# Patient Record
Sex: Female | Born: 1980 | Race: White | Hispanic: No | Marital: Single | Smoking: Never smoker
Health system: Southern US, Community
[De-identification: ages and names within clinical notes are randomized; demographics above are authoritative.]

## PROBLEM LIST (undated history)

## (undated) DIAGNOSIS — M1990 Unspecified osteoarthritis, unspecified site: Secondary | ICD-10-CM

## (undated) DIAGNOSIS — E079 Disorder of thyroid, unspecified: Secondary | ICD-10-CM

## (undated) DIAGNOSIS — F329 Major depressive disorder, single episode, unspecified: Secondary | ICD-10-CM

## (undated) DIAGNOSIS — E039 Hypothyroidism, unspecified: Secondary | ICD-10-CM

## (undated) DIAGNOSIS — F419 Anxiety disorder, unspecified: Secondary | ICD-10-CM

## (undated) DIAGNOSIS — R51 Headache: Secondary | ICD-10-CM

## (undated) DIAGNOSIS — F32A Depression, unspecified: Secondary | ICD-10-CM

## (undated) HISTORY — PX: TYMPANOSTOMY TUBE PLACEMENT: SHX32

## (undated) HISTORY — PX: WISDOM TOOTH EXTRACTION: SHX21

## (undated) HISTORY — PX: TONSILLECTOMY: SHX5217

## (undated) HISTORY — DX: Disorder of thyroid, unspecified: E07.9

## (undated) HISTORY — DX: Unspecified osteoarthritis, unspecified site: M19.90

---
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
## 2000-05-15 ENCOUNTER — Other Ambulatory Visit: Admission: RE | Admit: 2000-05-15 | Discharge: 2000-05-15 | Payer: Self-pay | Admitting: Otolaryngology

## 2000-05-15 ENCOUNTER — Encounter (INDEPENDENT_AMBULATORY_CARE_PROVIDER_SITE_OTHER): Payer: Self-pay | Admitting: Specialist

## 2000-07-17 ENCOUNTER — Other Ambulatory Visit: Admission: RE | Admit: 2000-07-17 | Discharge: 2000-07-17 | Payer: Self-pay | Admitting: Obstetrics and Gynecology

## 2001-02-06 ENCOUNTER — Other Ambulatory Visit: Admission: RE | Admit: 2001-02-06 | Discharge: 2001-02-06 | Payer: Self-pay | Admitting: Obstetrics and Gynecology

## 2001-08-18 ENCOUNTER — Other Ambulatory Visit: Admission: RE | Admit: 2001-08-18 | Discharge: 2001-08-18 | Payer: Self-pay | Admitting: Obstetrics and Gynecology

## 2002-07-20 ENCOUNTER — Encounter: Admission: RE | Admit: 2002-07-20 | Discharge: 2002-07-20 | Payer: Self-pay | Admitting: Occupational Medicine

## 2002-07-20 ENCOUNTER — Encounter: Payer: Self-pay | Admitting: Occupational Medicine

## 2002-10-21 ENCOUNTER — Other Ambulatory Visit: Admission: RE | Admit: 2002-10-21 | Discharge: 2002-10-21 | Payer: Self-pay | Admitting: Obstetrics and Gynecology

## 2002-10-23 ENCOUNTER — Other Ambulatory Visit: Admission: RE | Admit: 2002-10-23 | Discharge: 2002-10-23 | Payer: Self-pay | Admitting: *Deleted

## 2004-01-14 ENCOUNTER — Other Ambulatory Visit: Admission: RE | Admit: 2004-01-14 | Discharge: 2004-01-14 | Payer: Self-pay | Admitting: Obstetrics and Gynecology

## 2004-03-31 ENCOUNTER — Other Ambulatory Visit: Admission: RE | Admit: 2004-03-31 | Discharge: 2004-03-31 | Payer: Self-pay | Admitting: Obstetrics and Gynecology

## 2004-04-25 ENCOUNTER — Encounter: Admission: RE | Admit: 2004-04-25 | Discharge: 2004-04-25 | Payer: Self-pay | Admitting: Specialist

## 2004-12-28 ENCOUNTER — Other Ambulatory Visit: Admission: RE | Admit: 2004-12-28 | Discharge: 2004-12-28 | Payer: Self-pay | Admitting: Obstetrics and Gynecology

## 2005-02-09 ENCOUNTER — Emergency Department (HOSPITAL_COMMUNITY): Admission: EM | Admit: 2005-02-09 | Discharge: 2005-02-09 | Payer: Self-pay | Admitting: Family Medicine

## 2005-03-08 HISTORY — PX: CERVICAL BIOPSY  W/ LOOP ELECTRODE EXCISION: SUR135

## 2005-07-12 ENCOUNTER — Other Ambulatory Visit: Admission: RE | Admit: 2005-07-12 | Discharge: 2005-07-12 | Payer: Self-pay | Admitting: Obstetrics & Gynecology

## 2005-11-01 ENCOUNTER — Other Ambulatory Visit: Admission: RE | Admit: 2005-11-01 | Discharge: 2005-11-01 | Payer: Self-pay | Admitting: Obstetrics & Gynecology

## 2006-05-14 ENCOUNTER — Other Ambulatory Visit: Admission: RE | Admit: 2006-05-14 | Discharge: 2006-05-14 | Payer: Self-pay | Admitting: Obstetrics and Gynecology

## 2006-11-07 ENCOUNTER — Other Ambulatory Visit: Admission: RE | Admit: 2006-11-07 | Discharge: 2006-11-07 | Payer: Self-pay | Admitting: Obstetrics & Gynecology

## 2007-05-09 ENCOUNTER — Other Ambulatory Visit: Admission: RE | Admit: 2007-05-09 | Discharge: 2007-05-09 | Payer: Self-pay | Admitting: Obstetrics and Gynecology

## 2008-01-16 ENCOUNTER — Other Ambulatory Visit: Admission: RE | Admit: 2008-01-16 | Discharge: 2008-01-16 | Payer: Self-pay | Admitting: Obstetrics and Gynecology

## 2009-11-10 ENCOUNTER — Encounter: Admission: RE | Admit: 2009-11-10 | Discharge: 2009-11-10 | Payer: Self-pay | Admitting: Internal Medicine

## 2011-12-27 LAB — HM PAP SMEAR: HM Pap smear: NEGATIVE

## 2012-10-03 ENCOUNTER — Telehealth: Payer: Self-pay | Admitting: Emergency Medicine

## 2012-10-03 NOTE — Telephone Encounter (Signed)
Spoke with Toni Amend at [facility] who advised that order for Pristique has prior authorization approval. ID # [id number].  [facility] [address] and they [facility] rx with approval.  Left message on voicemail to advise of approval from dates today-13/31/2039

## 2012-12-16 ENCOUNTER — Telehealth: Payer: Self-pay | Admitting: Nurse Practitioner

## 2012-12-17 MED ORDER — DESVENLAFAXINE SUCCINATE ER 50 MG PO TB24: ORAL_TABLET | ORAL | Status: DC

## 2012-12-17 NOTE — Telephone Encounter (Signed)
Last AEX 12/27/11 AEX scheduled for 01/12/13 Pristiq 50 mg #90/0refills (patient normally gets 3 months at a time)

## 2012-12-17 NOTE — Addendum Note (Signed)
Addended by: Lorraine Lax on: 12/17/2012 09:27 AM   Modules accepted: Orders

## 2013-01-07 ENCOUNTER — Encounter: Payer: Self-pay | Admitting: Nurse Practitioner

## 2013-01-12 ENCOUNTER — Ambulatory Visit (INDEPENDENT_AMBULATORY_CARE_PROVIDER_SITE_OTHER): Payer: BC Managed Care – PPO | Admitting: Nurse Practitioner

## 2013-01-12 ENCOUNTER — Encounter: Payer: Self-pay | Admitting: Nurse Practitioner

## 2013-01-12 VITALS — BP 110/64 | HR 60 | Ht 64.25 in | Wt 112.0 lb

## 2013-01-12 DIAGNOSIS — Z0000 Encounter for general adult medical examination without abnormal findings: Secondary | ICD-10-CM

## 2013-01-12 DIAGNOSIS — Z113 Encounter for screening for infections with a predominantly sexual mode of transmission: Secondary | ICD-10-CM

## 2013-01-12 DIAGNOSIS — Z01419 Encounter for gynecological examination (general) (routine) without abnormal findings: Secondary | ICD-10-CM

## 2013-01-12 LAB — POCT URINALYSIS DIPSTICK
GLUCOSE UA: NEGATIVE
Ketones, UA: NEGATIVE
NITRITE UA: NEGATIVE
Protein, UA: NEGATIVE
RBC UA: NEGATIVE
UROBILINOGEN UA: NEGATIVE
pH, UA: 6.5

## 2013-01-12 LAB — HEMOGLOBIN, FINGERSTICK: HEMOGLOBIN, FINGERSTICK: 13.2 g/dL (ref 12.0–16.0)

## 2013-01-12 NOTE — Patient Instructions (Signed)

## 2013-01-12 NOTE — Progress Notes (Signed)
Patient ID: Michelle Gross, female   DOB: [birth year], 33 y.o.   MRN: [id number] 33 y.o. G0P0 Single Caucasian Fe here for annual exam. Menses at 7 days. 3- days heavy with 1 day of very heavy changing overnight pad every 2 hours. States this last menses at mid cycle had a 'gush' of clear to white fluid enough to fill a pad.  Now dating with a new partner for a month.  Ended previous relationship in August.  Now working with a [place] based company in [place].  Patient's last menstrual period was 01/04/2013.          Sexually active: yes  The current method of family planning is none and condoms when necessary.    Exercising: yes  Gym/ health club routine includes curves twice weekly and walking the dogs. Smoker:  no  Health Maintenance: Pap:  12/27/11, WNL, neg HR HPV (history of CIN III) TDaP:  08/2008 Labs:  HB: 13.2 Urine:  3+ Bilirubin, trace leuk's, ph 6.5   reports that she has never smoked. She has never used smokeless tobacco.  Past Medical History  Diagnosis Date  . Thyroid disease     hypo  . Arthritis     Past Surgical History  Procedure Laterality Date  . 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 Tonsillectomy    . Fracture surgery Left 1999    x 3, arm fracture  . Tympanostomy tube placement      Current Outpatient Prescriptions  Medication Sig Dispense Refill  . desvenlafaxine (PRISTIQ) 50 MG 24 hr tablet TAKE 1 TABLET BY MOUTH EVERY DAY  90 tablet  0  . etodolac (LODINE) 400 MG tablet Take 400 mg by mouth daily.      Marland Kitchen levothyroxine (SYNTHROID, LEVOTHROID) 50 MCG tablet Take 50 mcg by mouth daily before breakfast.       No current facility-administered medications for this visit.    Family History  Problem Relation Age of Onset  . Breast cancer Mother 17    DCIS, lumpectomy and chemo  . Cancer Sister 8    thyroid  . Diabetes Paternal Grandmother   . Cancer Paternal Grandfather     liver  . Thyroid disease Maternal Grandmother      hypo  . Migraines Father   . Migraines Sister   . Thyroid disease Sister     hypo  . Osteoporosis Maternal Grandmother     ROS:  Pertinent items are noted in HPI.  Otherwise, a comprehensive ROS was negative.  Exam:   BP 110/64  Pulse 60  Ht 5' 4.25" (1.632 m)  Wt 112 lb (50.803 kg)  BMI 19.07 kg/m2  LMP 01/04/2013 Height: 5' 4.25" (163.2 cm)  Ht Readings from Last 3 Encounters:  01/12/13 5' 4.25" (1.632 m)    General appearance: alert, cooperative and appears stated age Head: Normocephalic, without obvious abnormality, atraumatic Neck: no adenopathy, supple, symmetrical, trachea midline and thyroid normal to inspection and palpation Lungs: clear to auscultation bilaterally Breasts: normal appearance, no masses or tenderness Heart: regular rate and rhythm Abdomen: soft, non-tender; no masses,  no organomegaly Extremities: extremities normal, atraumatic, no cyanosis or edema Skin: Skin color, texture, turgor normal. No rashes or lesions Lymph nodes: Cervical, supraclavicular, and axillary nodes normal. No abnormal inguinal nodes palpated Neurologic: Grossly normal   Pelvic: External genitalia:  no lesions              Urethra:  normal appearing urethra with no masses, tenderness or lesions              Bartholin's and Skene's: normal                 Vagina: normal appearing vagina with normal color and discharge, no lesions              Cervix: anteverted              Pap taken: no Bimanual Exam:  Uterus:  normal size, contour, position, consistency, mobility, non-tender              Adnexa: no mass, fullness, tenderness               Rectovaginal: Confirms               Anus:  normal sphincter tone, no lesions  A:  Well Woman with normal exam  Condoms for birth control  History of menorrhagia - did not do well on OCP in the past last time took OCP was 2004  R/O STD's  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

## 2013-01-13 ENCOUNTER — Telehealth: Payer: Self-pay | Admitting: Nurse Practitioner

## 2013-01-13 ENCOUNTER — Other Ambulatory Visit: Payer: Self-pay

## 2013-01-13 LAB — STD PANEL
HEP B S AG: NEGATIVE
HIV: NONREACTIVE

## 2013-01-13 MED ORDER — DESVENLAFAXINE SUCCINATE ER 50 MG PO TB24: ORAL_TABLET | ORAL | Status: DC

## 2013-01-13 NOTE — Progress Notes (Signed)
Encounter reviewed by Dr. Josefa Half. Patient will be contacted to come in for repeat urinalysis with micro and culture.

## 2013-01-13 NOTE — Telephone Encounter (Signed)
Patient calling re: for forgot to ask P. Michelle Gross for Prestiq refills at her AEX visit yesterday.  [facility] [address]

## 2013-01-13 NOTE — Telephone Encounter (Signed)
Patient had her AEX with PG on 01/12/13 and forgot to ask for this RX. Please sign if ok to refill. Her chart is on your desk, if needed.

## 2013-01-13 NOTE — Telephone Encounter (Signed)
Refill request routed to [facility] in epic.

## 2013-01-14 LAB — IPS PAP TEST WITH REFLEX TO HPV

## 2013-01-15 LAB — IPS N GONORRHOEA AND CHLAMYDIA BY PCR

## 2013-01-19 ENCOUNTER — Telehealth: Payer: Self-pay | Admitting: *Deleted

## 2013-01-19 MED ORDER — DESVENLAFAXINE SUCCINATE ER 50 MG PO TB24: 50.0000 mg | ORAL_TABLET | Freq: Every day | ORAL | Status: DC

## 2013-01-19 NOTE — Telephone Encounter (Signed)
Message copied by Graylon Good on Mon Jan 19, 2013  1:28 PM ------      Message from: Regina Eck      Created: Fri Jan 16, 2013  4:37 AM       STD screening negative      Pap smear negative 02      Sent to my chart ------

## 2013-01-19 NOTE — Telephone Encounter (Signed)
Ok to refill for a year? 

## 2013-01-19 NOTE — Telephone Encounter (Signed)
I have attempted to contact this patient by phone with the following results: left message to return my call on answering machine (mobile).  Per Waldemar Dickens, pt also needs urine micro and culture.

## 2013-01-20 ENCOUNTER — Other Ambulatory Visit: Payer: Self-pay | Admitting: Nurse Practitioner

## 2013-01-20 ENCOUNTER — Ambulatory Visit (INDEPENDENT_AMBULATORY_CARE_PROVIDER_SITE_OTHER): Payer: BC Managed Care – PPO

## 2013-01-20 VITALS — BP 115/71 | HR 82 | Resp 16 | Wt 114.2 lb

## 2013-01-20 DIAGNOSIS — R8290 Unspecified abnormal findings in urine: Secondary | ICD-10-CM

## 2013-01-20 DIAGNOSIS — R82998 Other abnormal findings in urine: Secondary | ICD-10-CM

## 2013-01-20 NOTE — Progress Notes (Signed)
Patient here for urine culture and micro. States she did have another episode of bleeding/spotting for about 12 hours after her exam.  Also has noticed that she will bleed/spot for about 12 hours after exercising and had the one episode of heavy bleeding after raking her yard. Patient aware PG will discuss this with Dr Sabra Heck to see what the next step is. Please advise-patient aware Dr Sabra Heck is out of the office until Thursday.

## 2013-01-20 NOTE — Telephone Encounter (Signed)
Pt notified on 1/12.  appt scheduled to come in on 1/13 for labs.

## 2013-01-21 LAB — URINALYSIS, MICROSCOPIC ONLY
BACTERIA UA: NONE SEEN
CRYSTALS: NONE SEEN
Casts: NONE SEEN
SQUAMOUS EPITHELIAL / LPF: NONE SEEN

## 2013-01-21 LAB — URINE CULTURE
Colony Count: NO GROWTH
Organism ID, Bacteria: NO GROWTH

## 2013-01-23 ENCOUNTER — Telehealth: Payer: Self-pay | Admitting: *Deleted

## 2013-01-23 ENCOUNTER — Telehealth: Payer: Self-pay | Admitting: Obstetrics & Gynecology

## 2013-01-23 NOTE — Telephone Encounter (Signed)
Message copied by Jaymes Graff on Fri Jan 23, 2013 12:49 PM ------      Message from: Megan Salon      Created: Fri Jan 23, 2013  7:03 AM                   ----- Message -----         From: Maryann Conners, CMA         Sent: 01/20/2013   4:35 PM           To: Lyman Speller, MD             ------

## 2013-01-23 NOTE — Addendum Note (Signed)
Addended by: Megan Salon on: 01/23/2013 07:03 AM   Modules accepted: Orders

## 2013-01-23 NOTE — Telephone Encounter (Signed)
Telephoned patient/ advised of $25 insurance quote for SHGM/ scheduled SHGM/ advised patient of cancellation policy and cancellation fee/ patient agreeable/ssf

## 2013-01-23 NOTE — Telephone Encounter (Signed)
Call to patient to advise of Dr Ammie Ferrier instruction. LMTCB. VM has name confirmation.

## 2013-01-23 NOTE — Progress Notes (Signed)
Pt has SHGM about a year and a half ago.  Reviewed Chart.  Needs Repeat Ultrasound.  She is an anxious person and has declined OCPs.  Order placed.

## 2013-02-03 NOTE — Telephone Encounter (Signed)
Patient would like reschedule upcoming Pus/ Shgm appointment. Patient needs her next appointment during a certain time of her cycle.

## 2013-02-04 NOTE — Telephone Encounter (Signed)
Message left to return call to [place] at [phone] could also talk to Gabriel Cirri if I am not available for [facility] appointment. Usually done within days 1-10 of cycle.

## 2013-02-05 ENCOUNTER — Other Ambulatory Visit: Payer: BC Managed Care – PPO | Admitting: Obstetrics & Gynecology

## 2013-02-05 ENCOUNTER — Other Ambulatory Visit: Payer: BC Managed Care – PPO

## 2013-02-06 ENCOUNTER — Telehealth: Payer: Self-pay | Admitting: *Deleted

## 2013-02-06 NOTE — Telephone Encounter (Signed)
Call to patient to advise of order for Cytotec prior to [facility]. Explained purpose and benefits of medication.  Patient states she has had this procedure without this medication and done fine.  She states it isnt as bad as her very bad periods.  Advised that since she has had this before without problems, it is ok to omit this step.  This was just to make it easier for patient.    Per pre-EPIC chart, she did have SHGM 11-07-11.  She requests to check with MD to confirm ok to omit this.  Advised I will call her Monday if Dr Sabra Heck wants her to take medication.  Pre-EPIC chart to your office.

## 2013-02-06 NOTE — Telephone Encounter (Signed)
PUS only is ok.

## 2013-02-06 NOTE — Telephone Encounter (Signed)
Yes that is a good idea.  Cytotec 220mcg pv night before and am of procedure. #2/0RF

## 2013-02-06 NOTE — Telephone Encounter (Signed)
Patient scheduled for [facility] with Dr. Sabra Heck on 02/12/13.  States started cycle on Tuesday 02/03/13 so 02/12/13 will be day 10.  Patient aware of cancellation policy.  Routing to provider for final review. Patient agreeable to disposition. Will close encounter

## 2013-02-06 NOTE — Telephone Encounter (Signed)
Agree.  Encounter closed.

## 2013-02-06 NOTE — Telephone Encounter (Signed)
Thank you. Encounter closed. 

## 2013-02-06 NOTE — Telephone Encounter (Signed)
Dr. Sabra Heck will patient need cytotec prior to [facility]? G0P0

## 2013-02-09 NOTE — Telephone Encounter (Signed)
Spoke with pt to give cytotec instructions, and pt states she doesn't think she will need this, as the last [facility] was so easy for her she had no pain. Pt would rather not take medication if she doesn't have to. Pt states, "unless this procedure is going to be different than the last one, I won't need it."

## 2013-02-12 ENCOUNTER — Other Ambulatory Visit: Payer: Self-pay | Admitting: Obstetrics & Gynecology

## 2013-02-12 ENCOUNTER — Ambulatory Visit (INDEPENDENT_AMBULATORY_CARE_PROVIDER_SITE_OTHER): Payer: BC Managed Care – PPO

## 2013-02-12 ENCOUNTER — Ambulatory Visit (INDEPENDENT_AMBULATORY_CARE_PROVIDER_SITE_OTHER): Payer: BC Managed Care – PPO | Admitting: Obstetrics & Gynecology

## 2013-02-12 ENCOUNTER — Other Ambulatory Visit: Payer: BC Managed Care – PPO

## 2013-02-12 DIAGNOSIS — N938 Other specified abnormal uterine and vaginal bleeding: Secondary | ICD-10-CM

## 2013-02-12 DIAGNOSIS — R8290 Unspecified abnormal findings in urine: Secondary | ICD-10-CM

## 2013-02-12 DIAGNOSIS — N920 Excessive and frequent menstruation with regular cycle: Secondary | ICD-10-CM

## 2013-02-12 DIAGNOSIS — N949 Unspecified condition associated with female genital organs and menstrual cycle: Secondary | ICD-10-CM

## 2013-02-12 DIAGNOSIS — D259 Leiomyoma of uterus, unspecified: Secondary | ICD-10-CM

## 2013-02-12 DIAGNOSIS — R82998 Other abnormal findings in urine: Secondary | ICD-10-CM

## 2013-02-12 DIAGNOSIS — N946 Dysmenorrhea, unspecified: Secondary | ICD-10-CM

## 2013-02-12 DIAGNOSIS — N9489 Other specified conditions associated with female genital organs and menstrual cycle: Secondary | ICD-10-CM

## 2013-02-12 DIAGNOSIS — D219 Benign neoplasm of connective and other soft tissue, unspecified: Secondary | ICD-10-CM

## 2013-02-12 DIAGNOSIS — N8500 Endometrial hyperplasia, unspecified: Secondary | ICD-10-CM

## 2013-02-12 NOTE — Progress Notes (Signed)
33 y.o. G0 SWF here for a pelvic ultrasound with sonohystogram due to increased bleeding.  Flow last almost seven days and is heavy for three days.  Sometimes passes clots.  She has experienced an episode or two of clear vaginal discharge, almost like a gush, as well.  She had an ultrasound about two years ago.  Coping with ending relationship ok.  Job change has been good for her.  Long term, hopes to be able to get married and have a child.    Indication: menorrhagia  Patient's last menstrual period was 02/03/2013.  Contraception: condoms but not needed right now.  Technique:  Both transabdominal and transvaginal ultrasound examinations of the pelvis were performed. Transabdominal technique was performed for global imaging of the pelvis including uterus,  ovaries, adnexal regions, and pelvic cul-de-sac.  It was necessary to proceed with endovaginal exam following the abdominal ultrasound transabdominal exam to visualize the endometrium and adnexa.  Color and duplex Doppler ultrasound was utilized to evaluate blood flow to the ovaries.   FINDINGS: UTERUS: 7.7 x 5.2 x 4.3cm with probable two fibroids 1.4 and 1.8cm EMS: 9.62mm ADNEXA: left ovary 3.8 x 2.4 x 8.2XH with 3.7JI follicle Right ovary 3.7 x 2.0 x 2.5cm CUL DE SAC: no free fluid  SHSG:  After obtaining appropriate verbal consent from patient, the cervix was visualized using a speculum, and prepped with betadine.  A tenaculum  was applied to the cervix.  Dilation of the cervix was not necessary. The catheter was passed into the uterus and sterile saline introduced, with the following findings: 16 x 46mm probable endometrial polyp.  Images reviewed with patient.  Due to bleeding, hysteroscopic resection with D&C recommended.  Procedure discussed with patient.  Recovery and pain management discussed.  Risks discussed including but not limited to bleeding, rare risk of transfusion, infection, 1% risk of uterine perforation with risks of fluid  deficit causing cardiac arrythmia, cerebral swelling and/or need to stop procedure early.  Fluid emboli and rare risk of death discussed.  DVT/PE, rare risk of risk of bowel/bladder/ureteral/vascular injury.  Patient aware if pathology abnormal she may need additional treatment.  All questions answered.    After discussing above, pt wants to consider laparoscopy at the same time.  I have felt, over the years that I have known her, that endometriosis is also a possibility for her.  She would like to consider this as well due to pelvic pain.  Incision locations, risks of bleeding and infection, rare risk of bowel/bladder/ureteral injury, DVT/PE, possible need to treat endometriosis, and possible need for additional and possibly non-surgical therapies discussed.  Pt in agreement with plan and ready to proceed.  All questions answered.  Assessment:  Menorrhagia, uterine fibroids, endometrial polyp, chronic pelvic pain Plan:  Hysteroscopy with polyp resection, D&C planned, diagnostic laparoscopy with possible cauterization of endometriosis planned.  Will have surgery scheduled.  Pt to be called with information/directions.  ~30 minutes spent with patient >50% of time was in face to face discussion of above.

## 2013-02-19 ENCOUNTER — Telehealth: Payer: Self-pay | Admitting: Obstetrics & Gynecology

## 2013-02-19 NOTE — Telephone Encounter (Signed)
Return call to patient regarding surgery.  States that Tuesday would be slightly easier.  Will schedule and call her back.   Case request for Hysteroscopy/D&C/Resectoscope sent for 03-16-13.

## 2013-02-19 NOTE — Telephone Encounter (Signed)
Patient calling to set up surgery.

## 2013-02-20 ENCOUNTER — Telehealth: Payer: Self-pay | Admitting: Obstetrics & Gynecology

## 2013-02-20 NOTE — Telephone Encounter (Signed)
Thank you.  You can close encounter when case scheduled.

## 2013-02-20 NOTE — Telephone Encounter (Signed)
Lmtcb, need to go over surgery benefits

## 2013-02-25 NOTE — Telephone Encounter (Signed)
lmtcb to discuss insurance information for upcoming surgery

## 2013-02-26 NOTE — Telephone Encounter (Signed)
Patient returned call. Advised that per benefits quoted, patient liability for surgeons fees is [phone]. Payment is due 02.23.2015.  Patient agrees and will mail 02.20.2015.

## 2013-02-27 ENCOUNTER — Encounter (HOSPITAL_COMMUNITY): Payer: Self-pay | Admitting: Pharmacist

## 2013-03-03 ENCOUNTER — Telehealth: Payer: Self-pay | Admitting: *Deleted

## 2013-03-03 NOTE — Telephone Encounter (Signed)
Patient calling to ask how long to expect to be in surgery.  Discussed that sche is scheduled for approximately one hour but may not take that long. Patient asking if this includes the laparoscopy and any treatment for endometriosis.  Advised I will check with Dr Sabra Heck and call her back. Surgical instructions reviewed and will mail to patient.  Post op scheduled for 03-30-13, 2 weeks post surgery.  Scheduled as Hysteroscopy/D&C.  Do I need to add Laparoscopy? Does she need preop?

## 2013-03-03 NOTE — Telephone Encounter (Signed)
Patient called today with additional questions regarding surgery.  Wanted to know how long to expect surgery to take.  Also if she should take medications morning of surgery.  Advised hosp anesthesia will determine what meds to take morning of, follow their instructions.  Patient states she would like to have laparoscopy done since she is having the D&C.  Dr Sabra Heck has talked with her about this on several occasions to check for endometriosis and patient would like to have both procedures.  Advised will review with Dr Sabra Heck and hospital and call her back.

## 2013-03-03 NOTE — Telephone Encounter (Signed)
Ok to schedule laparoscopy and hysteroscopy same day.

## 2013-03-09 NOTE — Telephone Encounter (Signed)
That is fine.  We can bill after the surgery is done.

## 2013-03-09 NOTE — Telephone Encounter (Signed)
Patient is calling Michelle Gross to follow up on information about surgery.

## 2013-03-09 NOTE — Telephone Encounter (Signed)
Per Gay Filler, add laparoscopy [phone]) to this patients surgery. Patients deductible is still not met.  Patient liability for added charge is $647.59.  I telephoned patient with this information. She states that she cannot make this additional payment prior to her surgery date.

## 2013-03-09 NOTE — Telephone Encounter (Signed)
Call back to patient, LM (VM confirms name) that surgery is scheduled with laparoscopy added for 03-16-13.  Call back for additional clarification.

## 2013-03-10 ENCOUNTER — Encounter: Payer: Self-pay | Admitting: Obstetrics & Gynecology

## 2013-03-10 DIAGNOSIS — N9489 Other specified conditions associated with female genital organs and menstrual cycle: Secondary | ICD-10-CM | POA: Insufficient documentation

## 2013-03-10 DIAGNOSIS — N946 Dysmenorrhea, unspecified: Secondary | ICD-10-CM | POA: Insufficient documentation

## 2013-03-10 DIAGNOSIS — N920 Excessive and frequent menstruation with regular cycle: Secondary | ICD-10-CM | POA: Insufficient documentation

## 2013-03-10 DIAGNOSIS — D219 Benign neoplasm of connective and other soft tissue, unspecified: Secondary | ICD-10-CM | POA: Insufficient documentation

## 2013-03-10 NOTE — Telephone Encounter (Signed)
Telephone patient. Advised patient that she would be billed after the surgery for the added charge. Patient agreeable.

## 2013-03-11 ENCOUNTER — Telehealth: Payer: Self-pay | Admitting: *Deleted

## 2013-03-11 NOTE — Telephone Encounter (Signed)
See next phone message for additional information regarding surgery schedule.  Encounter closed.

## 2013-03-11 NOTE — Telephone Encounter (Signed)
Call to patient and notified That Dr Sabra Heck summoned for jury duty and now assigned to a trial.  Potential need for reschedule of surgery date if case has not concluded by end of week.  Patient very understanding.  I will update her on Friday afternoon.   Patient needs note for work that she will be out on Monday for the surgery. She states she was told she could return to work the following day.  Advised that initially when scheduled  For hysteroscopy only, that would have been realistic but now with laparoscopy and abdominal incision, although only about a inch long, may need additional time, possibly even a week depending on what is found and/or treated.    Please advise on minimum recommended time out of work.  Patient decided to wait till Monday and see if surgery even occurs and then have [place] fax note to her employer.

## 2013-03-12 ENCOUNTER — Encounter (HOSPITAL_COMMUNITY): Payer: Self-pay

## 2013-03-12 ENCOUNTER — Encounter (HOSPITAL_COMMUNITY)
Admission: RE | Admit: 2013-03-12 | Discharge: 2013-03-12 | Disposition: A | Discharge status: Home / Self Care | Payer: BC Managed Care – PPO | Source: Ambulatory Visit | Attending: Obstetrics & Gynecology | Admitting: Obstetrics & Gynecology

## 2013-03-12 DIAGNOSIS — Z01812 Encounter for preprocedural laboratory examination: Secondary | ICD-10-CM | POA: Insufficient documentation

## 2013-03-12 HISTORY — DX: Depression, unspecified: F32.A

## 2013-03-12 HISTORY — DX: Hypothyroidism, unspecified: E03.9

## 2013-03-12 HISTORY — DX: Headache: R51

## 2013-03-12 HISTORY — DX: Anxiety disorder, unspecified: F41.9

## 2013-03-12 HISTORY — DX: Major depressive disorder, single episode, unspecified: F32.9

## 2013-03-12 LAB — CBC
HCT: 36.8 % (ref 36.0–46.0)
HEMOGLOBIN: 12.4 g/dL (ref 12.0–15.0)
MCH: 30.4 pg (ref 26.0–34.0)
MCHC: 33.7 g/dL (ref 30.0–36.0)
MCV: 90.2 fL (ref 78.0–100.0)
Platelets: 229 10*3/uL (ref 150–400)
RBC: 4.08 MIL/uL (ref 3.87–5.11)
RDW: 12.5 % (ref 11.5–15.5)
WBC: 7.2 10*3/uL (ref 4.0–10.5)

## 2013-03-12 NOTE — Telephone Encounter (Signed)
Patient notified of Dr Ammie Ferrier recommended time out of work.  Encounter closed.

## 2013-03-12 NOTE — Telephone Encounter (Signed)
With laparoscopy, she should prepare for being out of work for one week.

## 2013-03-12 NOTE — Patient Instructions (Addendum)
   Your procedure is scheduled on:  Monday, Mar 9  Enter through the [employer] [facility] at:  6 AM Pick up the phone at the desk and dial [phone] and inform us of your arrival.  Please call this number if you have any problems the morning of surgery: [phone]  Remember: Do not eat or drink after midnight: Sunday Take these medicines the morning of surgery with a SIP OF WATER: synthroid, pristiq  Do not wear jewelry, make-up, or FINGER nail polish No metal in your hair or on your body. Do not wear lotions, powders, perfumes.  You may wear deodorant.  Do not bring valuables to the hospital. Contacts, dentures or bridgework may not be worn into surgery.  Patients discharged on the day of surgery will not be allowed to drive home.  Home with friend Fayne Norrie cell [phone].

## 2013-03-12 NOTE — Telephone Encounter (Signed)
Call to patient, VM has name confirmation.  LM that due to extended jury duty, we will need to cancel surgery on Monday 03-16-13 and reschedule.  LMTCB with new date preferences.

## 2013-03-16 NOTE — Telephone Encounter (Signed)
LM on hosp PAT dept VM to check on PAT requirements for reschedule surgery date of 05-05-13. LMTCB.  Spoke to [place] at hosp central scheduling and surgery is rescheduled to 05-05-13 at [place], Case [phone].

## 2013-03-16 NOTE — Telephone Encounter (Signed)
Return call to patient regarding date preference for rescheduling surgery.  Patient requests first available Tuesday at 730 since this is really what works best for her schedule.  Looking at hospital schedule, first available Tuesday at 730 is 05-05-13.  (first avail Monday at 730 is 04-27-13) since only a week difference, she prefers the Tuesday 05-05-13 due to easier for her schedule on a Tuesday.  Will reschedule and call patient back.  Patient also checking on how long labs done at pre-op will be good for?  Advised I have to check at hospital for this info.

## 2013-03-16 NOTE — Telephone Encounter (Signed)
Pt returning call to reschedule surgery.

## 2013-03-18 NOTE — Telephone Encounter (Signed)
Michelle Gross from hosp PAT called back on 03-16-13 and advised me that patient had a normal CBC and that since >30 days since lab was done, would need repeat.  Since she did not have medical conditions and lab result was normal, Dr Sabra Heck and anesthesia could review morning of surgery and if needed, could repeat it then.  She would not have to have another pre-op at hospital.  Michelle Gross said she would advise patient of this.

## 2013-03-18 NOTE — Telephone Encounter (Signed)
Routing to provider for final review. Patient agreeable to disposition. Will close encounter.     

## 2013-03-25 ENCOUNTER — Telehealth: Payer: Self-pay | Admitting: Obstetrics & Gynecology

## 2013-03-25 NOTE — Telephone Encounter (Signed)
Gay Filler just wanted to bring to your attention that patient needs a post op appt she called this morning and canceled the original because her surgery got postponed from dr Sanjuan Dame jury duty.

## 2013-03-30 ENCOUNTER — Ambulatory Visit: Payer: BC Managed Care – PPO | Admitting: Obstetrics & Gynecology

## 2013-03-31 NOTE — Telephone Encounter (Signed)
Call to patient and rescheduled post op to 05-19-13 based on rescheduled surgery date of 05-05-13.  Previous date canceled due to provider on jury duty. Updated surgery instruction sheet mailed.  Do you want to see her again prior to surgery date?

## 2013-04-01 NOTE — Telephone Encounter (Signed)
No, I don't need to see her again.  Thank you.  Encounter closed.

## 2013-04-12 ENCOUNTER — Emergency Department (INDEPENDENT_AMBULATORY_CARE_PROVIDER_SITE_OTHER)
Admission: EM | Admit: 2013-04-12 | Discharge: 2013-04-12 | Disposition: A | Discharge status: Home / Self Care | Payer: BC Managed Care – PPO | Source: Home / Self Care | Attending: Emergency Medicine | Admitting: Emergency Medicine

## 2013-04-12 ENCOUNTER — Emergency Department (HOSPITAL_COMMUNITY): Payer: BC Managed Care – PPO

## 2013-04-12 ENCOUNTER — Encounter (HOSPITAL_COMMUNITY): Payer: Self-pay | Admitting: Emergency Medicine

## 2013-04-12 ENCOUNTER — Emergency Department (INDEPENDENT_AMBULATORY_CARE_PROVIDER_SITE_OTHER): Payer: BC Managed Care – PPO

## 2013-04-12 DIAGNOSIS — J111 Influenza due to unidentified influenza virus with other respiratory manifestations: Secondary | ICD-10-CM

## 2013-04-12 DIAGNOSIS — R69 Illness, unspecified: Principal | ICD-10-CM

## 2013-04-12 MED ORDER — BENZONATATE 100 MG PO CAPS: 100.0000 mg | ORAL_CAPSULE | Freq: Three times a day (TID) | ORAL | Status: DC | PRN

## 2013-04-12 MED ORDER — HYDROCOD POLST-CHLORPHEN POLST 10-8 MG/5ML PO LQCR: 5.0000 mL | Freq: Two times a day (BID) | ORAL | Status: DC | PRN

## 2013-04-12 MED ORDER — OSELTAMIVIR PHOSPHATE 75 MG PO CAPS: 75.0000 mg | ORAL_CAPSULE | Freq: Two times a day (BID) | ORAL | Status: DC

## 2013-04-12 NOTE — ED Notes (Signed)
Pt  Reports     Symptoms  Of  Cough   Congested  Fever off and  On      With  Sinus drainage   Since  Friday              Symptoms   releived by otc meds  But  The  Cough  For the  Mi=ost part is  Non   Productive

## 2013-04-12 NOTE — ED Provider Notes (Signed)
Medical screening examination/treatment/procedure(s) were performed by non-physician practitioner and as supervising physician I was immediately available for consultation/collaboration.  Hoy Fallert, M.D.  Saliyah Gillin C Tryniti Laatsch, MD 04/12/13 2125 

## 2013-04-12 NOTE — ED Provider Notes (Signed)
CSN: [id number]     Arrival date & time 04/12/13  1554 History   First MD Initiated Contact with Patient 04/12/13 1606     Chief Complaint  Patient presents with  . 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 Returned on 04-09-2013 and URI sx began 04-10-2013.  Non-smoker.   Patient is a 33 y.o. female presenting with URI.  URI Presenting symptoms: congestion, cough, fatigue and fever   Presenting symptoms comment:  +nasal congestion Severity:  Moderate Onset quality:  Gradual Duration:  2 days Timing:  Constant Progression:  Worsening Chronicity:  New Associated symptoms: headaches and myalgias   Risk factors: recent travel     Past Medical History  Diagnosis Date  . Thyroid disease     hypo  . Depression   . Hypothyroidism   . Anxiety   . Arthritis     hands/feet/knee  . Headache(784.0)     otc med prn   Past Surgical History  Procedure Laterality Date  . 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 Tonsillectomy    . Fracture surgery Left 1999    x 3, arm fracture  . Tympanostomy tube placement    . Wisdom tooth extraction     Family History  Problem Relation Age of Onset  . Breast cancer Mother 24    DCIS, lumpectomy and chemo  . Cancer Sister 74    thyroid  . Diabetes Paternal Grandmother   . Cancer Paternal Grandfather     liver  . Thyroid disease Maternal Grandmother     hypo  . Migraines Father   . Migraines Sister   . Thyroid disease Sister     hypo  . Osteoporosis Maternal Grandmother    History  Substance Use Topics  . Smoking status: Never Smoker   . Smokeless tobacco: Never Used  . Alcohol Use: Yes     Comment: social   OB History   Grav Para Term Preterm Abortions TAB SAB Ect Mult Living   0 0             Review of Systems  Constitutional: Positive for fever and fatigue.   HENT: Positive for congestion.   Eyes: Negative.   Respiratory: Positive for cough.   Cardiovascular:       States chest is very sore from couging  Genitourinary: Negative.   Musculoskeletal: Positive for myalgias.  Neurological: Positive for headaches.    Allergies  Codeine; Neomycin; Prednisone; and Sulfa antibiotics  Home Medications   Current Outpatient Rx  Name  Route  Sig  Dispense  Refill  . Armodafinil (NUVIGIL) 150 MG tablet   Oral   Take 75 mg by mouth daily as needed.         . chlorpheniramine-HYDROcodone (TUSSIONEX) 10-8 MG/5ML LQCR   Oral   Take 5 mLs by mouth every 12 (twelve) hours as needed for cough.   50 mL   0   . colesevelam (WELCHOL) 625 MG tablet   Oral   Take 625 mg by mouth daily as needed.         . desvenlafaxine (PRISTIQ) 50 MG 24 hr tablet   Oral   Take 1 tablet (50 mg total) by mouth daily.   90 tablet   3     NEEDS REFILLS   . ibuprofen (ADVIL,MOTRIN) 200 MG tablet   Oral  Take 400-800 mg by mouth every 6 (six) hours as needed for headache, moderate pain or cramping.         Marland Kitchen levothyroxine (SYNTHROID, LEVOTHROID) 50 MCG tablet   Oral   Take 50 mcg by mouth daily before breakfast.         . oseltamivir (TAMIFLU) 75 MG capsule   Oral   Take 1 capsule (75 mg total) by mouth every 12 (twelve) hours.   10 capsule   0    BP 116/74  Pulse 114  Temp(Src) 99.5 F (37.5 C) (Oral)  Resp 25  SpO2 100%  LMP 04/06/2013 Physical Exam  Nursing note and vitals reviewed. Constitutional: She is oriented to person, place, and time. She appears well-developed and well-nourished. No distress.  HENT:  Head: Normocephalic and atraumatic.  Right Ear: Hearing, tympanic membrane, external ear and ear canal normal.  Left Ear: Hearing, tympanic membrane, external ear and ear canal normal.  Nose: Nose normal.  Mouth/Throat: Uvula is midline, oropharynx is clear and moist and mucous membranes are normal.  Eyes: Conjunctivae are normal.  Right eye exhibits no discharge. Left eye exhibits no discharge. No scleral icterus.  Cardiovascular: Regular rhythm and normal heart sounds.   +mild tachycardia  Pulmonary/Chest: Effort normal and breath sounds normal. No respiratory distress. She has no wheezes.  Musculoskeletal: Normal range of motion.  Lymphadenopathy:    She has no cervical adenopathy.  Neurological: She is alert and oriented to person, place, and time.  Skin: Skin is warm and dry.  Psychiatric: She has a normal mood and affect. Her behavior is normal.    ED Course  Procedures (including critical care time) Labs Review Labs Reviewed - No data to display Imaging Review Dg Chest 2 View  04/12/2013   CLINICAL DATA:  Cough, fever  EXAM: CHEST  2 VIEW  COMPARISON:  11/10/2009  FINDINGS: Cardiomediastinal silhouette is stable. Mild upper thoracic levoscoliosis. No acute infiltrate or pleural effusion. No pulmonary edema.  IMPRESSION: No active cardiopulmonary disease.   Electronically Signed   By: Lahoma Crocker M.D.   On: 04/12/2013 16:35     MDM   1. Influenza-like illness    Influenza-like illness. Will treat with Tamiflu and cough suppressant and advise close follow up with PCP. CXR unremarkable.     [place], [place] 04/12/13 1719

## 2013-05-04 MED ORDER — DEXTROSE 5 % IV SOLN
2.0000 g | INTRAVENOUS | Status: AC
  Administered 2013-05-05: 2 g via INTRAVENOUS
  Filled 2013-05-04: qty 2

## 2013-05-05 ENCOUNTER — Ambulatory Visit (HOSPITAL_COMMUNITY)
Admission: RE | Admit: 2013-05-05 | Discharge: 2013-05-05 | Disposition: A | Discharge status: Home / Self Care | Payer: BC Managed Care – PPO | Source: Ambulatory Visit | Attending: Obstetrics & Gynecology | Admitting: Obstetrics & Gynecology

## 2013-05-05 ENCOUNTER — Ambulatory Visit (HOSPITAL_COMMUNITY): Payer: BC Managed Care – PPO | Admitting: Anesthesiology

## 2013-05-05 ENCOUNTER — Other Ambulatory Visit: Payer: Self-pay | Admitting: Obstetrics & Gynecology

## 2013-05-05 ENCOUNTER — Encounter (HOSPITAL_COMMUNITY): Payer: Self-pay | Admitting: Anesthesiology

## 2013-05-05 ENCOUNTER — Encounter (HOSPITAL_COMMUNITY): Payer: BC Managed Care – PPO | Admitting: Anesthesiology

## 2013-05-05 ENCOUNTER — Encounter (HOSPITAL_COMMUNITY)
Admission: RE | Disposition: A | Discharge status: Home / Self Care | Payer: Self-pay | Source: Ambulatory Visit | Attending: Obstetrics & Gynecology

## 2013-05-05 DIAGNOSIS — F329 Major depressive disorder, single episode, unspecified: Secondary | ICD-10-CM | POA: Insufficient documentation

## 2013-05-05 DIAGNOSIS — E039 Hypothyroidism, unspecified: Secondary | ICD-10-CM | POA: Insufficient documentation

## 2013-05-05 DIAGNOSIS — N8500 Endometrial hyperplasia, unspecified: Secondary | ICD-10-CM

## 2013-05-05 DIAGNOSIS — Z803 Family history of malignant neoplasm of breast: Secondary | ICD-10-CM | POA: Insufficient documentation

## 2013-05-05 DIAGNOSIS — N949 Unspecified condition associated with female genital organs and menstrual cycle: Secondary | ICD-10-CM | POA: Insufficient documentation

## 2013-05-05 DIAGNOSIS — N9489 Other specified conditions associated with female genital organs and menstrual cycle: Secondary | ICD-10-CM

## 2013-05-05 DIAGNOSIS — N946 Dysmenorrhea, unspecified: Secondary | ICD-10-CM | POA: Insufficient documentation

## 2013-05-05 DIAGNOSIS — N801 Endometriosis of ovary: Secondary | ICD-10-CM | POA: Insufficient documentation

## 2013-05-05 DIAGNOSIS — N803 Endometriosis of pelvic peritoneum, unspecified: Secondary | ICD-10-CM | POA: Insufficient documentation

## 2013-05-05 DIAGNOSIS — F3289 Other specified depressive episodes: Secondary | ICD-10-CM | POA: Insufficient documentation

## 2013-05-05 DIAGNOSIS — N840 Polyp of corpus uteri: Secondary | ICD-10-CM | POA: Insufficient documentation

## 2013-05-05 DIAGNOSIS — E0500 Thyrotoxicosis with diffuse goiter without thyrotoxic crisis or storm: Secondary | ICD-10-CM | POA: Insufficient documentation

## 2013-05-05 DIAGNOSIS — N804 Endometriosis of rectovaginal septum and vagina: Secondary | ICD-10-CM

## 2013-05-05 DIAGNOSIS — N8042 Endometriosis of rectovaginal septum with involvement of vagina: Secondary | ICD-10-CM

## 2013-05-05 DIAGNOSIS — K668 Other specified disorders of peritoneum: Secondary | ICD-10-CM | POA: Insufficient documentation

## 2013-05-05 DIAGNOSIS — N80109 Endometriosis of ovary, unspecified side, unspecified depth: Secondary | ICD-10-CM | POA: Insufficient documentation

## 2013-05-05 DIAGNOSIS — G8929 Other chronic pain: Secondary | ICD-10-CM | POA: Insufficient documentation

## 2013-05-05 DIAGNOSIS — R21 Rash and other nonspecific skin eruption: Secondary | ICD-10-CM

## 2013-05-05 DIAGNOSIS — N920 Excessive and frequent menstruation with regular cycle: Secondary | ICD-10-CM | POA: Insufficient documentation

## 2013-05-05 DIAGNOSIS — F411 Generalized anxiety disorder: Secondary | ICD-10-CM | POA: Insufficient documentation

## 2013-05-05 HISTORY — PX: BIOPSY: SHX5522

## 2013-05-05 HISTORY — PX: LAPAROSCOPY: SHX197

## 2013-05-05 HISTORY — PX: HYSTEROSCOPY WITH D & C: SHX1775

## 2013-05-05 HISTORY — PX: ABLATION ON ENDOMETRIOSIS: SHX5787

## 2013-05-05 LAB — CBC
HEMATOCRIT: 39.6 % (ref 36.0–46.0)
Hemoglobin: 13.6 g/dL (ref 12.0–15.0)
MCH: 30.8 pg (ref 26.0–34.0)
MCHC: 34.3 g/dL (ref 30.0–36.0)
MCV: 89.6 fL (ref 78.0–100.0)
Platelets: 233 10*3/uL (ref 150–400)
RBC: 4.42 MIL/uL (ref 3.87–5.11)
RDW: 12.7 % (ref 11.5–15.5)
WBC: 6.5 10*3/uL (ref 4.0–10.5)

## 2013-05-05 LAB — HCG, SERUM, QUALITATIVE: Preg, Serum: NEGATIVE

## 2013-05-05 SURGERY — LAPAROSCOPY OPERATIVE
Anesthesia: General | Site: Uterus

## 2013-05-05 MED ORDER — OXYCODONE-ACETAMINOPHEN 5-325 MG PO TABS: 1.0000 | ORAL_TABLET | Freq: Four times a day (QID) | ORAL | Status: DC | PRN

## 2013-05-05 MED ORDER — BUPIVACAINE HCL (PF) 0.25 % IJ SOLN
INTRAMUSCULAR | Status: AC
  Filled 2013-05-05: qty 10

## 2013-05-05 MED ORDER — KETOROLAC TROMETHAMINE 30 MG/ML IJ SOLN
INTRAMUSCULAR | Status: AC
  Filled 2013-05-05: qty 1

## 2013-05-05 MED ORDER — DEXAMETHASONE SODIUM PHOSPHATE 10 MG/ML IJ SOLN
INTRAMUSCULAR | Status: AC
  Filled 2013-05-05: qty 1

## 2013-05-05 MED ORDER — BUPIVACAINE HCL (PF) 0.25 % IJ SOLN
INTRAMUSCULAR | Status: DC | PRN
  Administered 2013-05-05: 7 mL

## 2013-05-05 MED ORDER — LACTATED RINGERS IV SOLN
INTRAVENOUS | Status: DC
  Administered 2013-05-05 (×2): via INTRAVENOUS

## 2013-05-05 MED ORDER — ONDANSETRON HCL 4 MG/2ML IJ SOLN
INTRAMUSCULAR | Status: AC
  Filled 2013-05-05: qty 2

## 2013-05-05 MED ORDER — ONDANSETRON HCL 4 MG/2ML IJ SOLN
INTRAMUSCULAR | Status: DC | PRN
  Administered 2013-05-05: 4 mg via INTRAVENOUS

## 2013-05-05 MED ORDER — LIDOCAINE-EPINEPHRINE 1 %-1:100000 IJ SOLN
INTRAMUSCULAR | Status: DC | PRN
  Administered 2013-05-05: 10 mL

## 2013-05-05 MED ORDER — MIDAZOLAM HCL 2 MG/2ML IJ SOLN
INTRAMUSCULAR | Status: DC | PRN
  Administered 2013-05-05: 1 mg via INTRAVENOUS

## 2013-05-05 MED ORDER — SODIUM CHLORIDE 0.9 % IJ SOLN
INTRAMUSCULAR | Status: AC
  Filled 2013-05-05: qty 10

## 2013-05-05 MED ORDER — KETOROLAC TROMETHAMINE 30 MG/ML IJ SOLN: 15.0000 mg | Freq: Once | INTRAMUSCULAR | Status: DC | PRN

## 2013-05-05 MED ORDER — SILVER NITRATE-POT NITRATE 75-25 % EX MISC
CUTANEOUS | Status: AC
Start: 2013-05-05 — End: 2013-05-05
  Filled 2013-05-05: qty 1

## 2013-05-05 MED ORDER — PROMETHAZINE HCL 25 MG PO TABS: 25.0000 mg | ORAL_TABLET | Freq: Four times a day (QID) | ORAL | Status: DC | PRN

## 2013-05-05 MED ORDER — SODIUM CHLORIDE 0.9 % IJ SOLN: 3.0000 mL | Freq: Two times a day (BID) | INTRAMUSCULAR | Status: DC

## 2013-05-05 MED ORDER — PROPOFOL 10 MG/ML IV BOLUS
INTRAVENOUS | Status: DC | PRN
  Administered 2013-05-05: 150 mg via INTRAVENOUS

## 2013-05-05 MED ORDER — KETOROLAC TROMETHAMINE 60 MG/2ML IM SOLN
INTRAMUSCULAR | Status: DC | PRN
  Administered 2013-05-05: 30 mg via INTRAMUSCULAR

## 2013-05-05 MED ORDER — KETOROLAC TROMETHAMINE 30 MG/ML IJ SOLN
INTRAMUSCULAR | Status: DC | PRN
  Administered 2013-05-05: 30 mg via INTRAVENOUS

## 2013-05-05 MED ORDER — ONDANSETRON HCL 4 MG/2ML IJ SOLN: 4.0000 mg | Freq: Once | INTRAMUSCULAR | Status: DC | PRN

## 2013-05-05 MED ORDER — FENTANYL CITRATE 0.05 MG/ML IJ SOLN
INTRAMUSCULAR | Status: AC
  Filled 2013-05-05: qty 5

## 2013-05-05 MED ORDER — GLYCOPYRROLATE 0.2 MG/ML IJ SOLN
INTRAMUSCULAR | Status: AC
  Filled 2013-05-05: qty 3

## 2013-05-05 MED ORDER — NEOSTIGMINE METHYLSULFATE 1 MG/ML IJ SOLN
INTRAMUSCULAR | Status: DC | PRN
  Administered 2013-05-05: 2.5 mg via INTRAVENOUS

## 2013-05-05 MED ORDER — MIDAZOLAM HCL 2 MG/2ML IJ SOLN
INTRAMUSCULAR | Status: AC
  Filled 2013-05-05: qty 2

## 2013-05-05 MED ORDER — NEOSTIGMINE METHYLSULFATE 1 MG/ML IJ SOLN
INTRAMUSCULAR | Status: AC
  Filled 2013-05-05: qty 1

## 2013-05-05 MED ORDER — LIDOCAINE-EPINEPHRINE 1 %-1:100000 IJ SOLN
INTRAMUSCULAR | Status: AC
  Filled 2013-05-05: qty 1

## 2013-05-05 MED ORDER — FENTANYL CITRATE 0.05 MG/ML IJ SOLN: 25.0000 ug | INTRAMUSCULAR | Status: DC | PRN

## 2013-05-05 MED ORDER — LIDOCAINE HCL (CARDIAC) 20 MG/ML IV SOLN
INTRAVENOUS | Status: DC | PRN
  Administered 2013-05-05: 60 mg via INTRAVENOUS

## 2013-05-05 MED ORDER — LIDOCAINE HCL (CARDIAC) 20 MG/ML IV SOLN
INTRAVENOUS | Status: AC
  Filled 2013-05-05: qty 5

## 2013-05-05 MED ORDER — GLYCINE 1.5 % IR SOLN
Status: DC | PRN
  Administered 2013-05-05: 1

## 2013-05-05 MED ORDER — LIDOCAINE-EPINEPHRINE 1 %-1:100000 IJ SOLN
INTRAMUSCULAR | Status: AC
Start: 2013-05-05 — End: 2013-05-05
  Filled 2013-05-05: qty 1

## 2013-05-05 MED ORDER — PROPOFOL 10 MG/ML IV EMUL
INTRAVENOUS | Status: AC
  Filled 2013-05-05: qty 20

## 2013-05-05 MED ORDER — DEXAMETHASONE SODIUM PHOSPHATE 10 MG/ML IJ SOLN
INTRAMUSCULAR | Status: DC | PRN
  Administered 2013-05-05: 10 mg via INTRAVENOUS

## 2013-05-05 MED ORDER — ROCURONIUM BROMIDE 100 MG/10ML IV SOLN
INTRAVENOUS | Status: DC | PRN
  Administered 2013-05-05: 30 mg via INTRAVENOUS

## 2013-05-05 MED ORDER — SODIUM CHLORIDE 0.9 % IJ SOLN
INTRAMUSCULAR | Status: DC | PRN
  Administered 2013-05-05: 10 mL

## 2013-05-05 MED ORDER — ROCURONIUM BROMIDE 100 MG/10ML IV SOLN
INTRAVENOUS | Status: AC
  Filled 2013-05-05: qty 1

## 2013-05-05 MED ORDER — PHENYLEPHRINE 40 MCG/ML (10ML) SYRINGE FOR IV PUSH (FOR BLOOD PRESSURE SUPPORT)
PREFILLED_SYRINGE | INTRAVENOUS | Status: AC
  Filled 2013-05-05: qty 5

## 2013-05-05 MED ORDER — PHENYLEPHRINE HCL 10 MG/ML IJ SOLN
INTRAMUSCULAR | Status: DC | PRN
  Administered 2013-05-05: 40 ug via INTRAVENOUS

## 2013-05-05 MED ORDER — MEPERIDINE HCL 25 MG/ML IJ SOLN: 6.2500 mg | INTRAMUSCULAR | Status: DC | PRN

## 2013-05-05 MED ORDER — SODIUM CHLORIDE 0.9 % IJ SOLN: 3.0000 mL | INTRAMUSCULAR | Status: DC | PRN

## 2013-05-05 MED ORDER — GLYCOPYRROLATE 0.2 MG/ML IJ SOLN
INTRAMUSCULAR | Status: DC | PRN
  Administered 2013-05-05: .5 mg via INTRAVENOUS

## 2013-05-05 MED ORDER — FENTANYL CITRATE 0.05 MG/ML IJ SOLN
INTRAMUSCULAR | Status: DC | PRN
  Administered 2013-05-05: 25 ug via INTRAVENOUS
  Administered 2013-05-05: 50 ug via INTRAVENOUS
  Administered 2013-05-05: 100 ug via INTRAVENOUS
  Administered 2013-05-05: 25 ug via INTRAVENOUS

## 2013-05-05 MED ORDER — SODIUM CHLORIDE 0.9 % IV SOLN: 250.0000 mL | INTRAVENOUS | Status: DC | PRN

## 2013-05-05 SURGICAL SUPPLY — 36 items
ADH SKN CLS APL DERMABOND .7 (GAUZE/BANDAGES/DRESSINGS) ×3
CABLE HIGH FREQUENCY MONO STRZ (ELECTRODE) ×5 IMPLANT
CANISTER SUCT 3000ML (MISCELLANEOUS) ×5 IMPLANT
CATH ROBINSON RED A/P 16FR (CATHETERS) ×11 IMPLANT
CHLORAPREP W/TINT 26ML (MISCELLANEOUS) ×5 IMPLANT
CLOTH BEACON ORANGE TIMEOUT ST (SAFETY) ×5 IMPLANT
CONTAINER PREFILL 10% NBF 60ML (FORM) ×10 IMPLANT
DERMABOND ADVANCED (GAUZE/BANDAGES/DRESSINGS) ×2
DERMABOND ADVANCED .7 DNX12 (GAUZE/BANDAGES/DRESSINGS) ×1 IMPLANT
DRAPE HYSTEROSCOPY (DRAPE) ×5 IMPLANT
DRSG TELFA 3X8 NADH (GAUZE/BANDAGES/DRESSINGS) ×5 IMPLANT
ELECT REM PT RETURN 9FT ADLT (ELECTROSURGICAL) ×5
ELECTRODE REM PT RTRN 9FT ADLT (ELECTROSURGICAL) ×1 IMPLANT
GLOVE BIOGEL PI IND STRL 7.0 (GLOVE) ×6 IMPLANT
GLOVE BIOGEL PI INDICATOR 7.0 (GLOVE) ×4
GLOVE ECLIPSE 6.5 STRL STRAW (GLOVE) ×10 IMPLANT
GOWN STRL REUS W/TWL LRG LVL3 (GOWN DISPOSABLE) ×10 IMPLANT
NDL SPNL 22GX3.5 QUINCKE BK (NEEDLE) ×2 IMPLANT
NEEDLE INSUFFLATION 120MM (ENDOMECHANICALS) ×5 IMPLANT
NEEDLE SPNL 22GX3.5 QUINCKE BK (NEEDLE) ×5 IMPLANT
PACK LAPAROSCOPY BASIN (CUSTOM PROCEDURE TRAY) ×5 IMPLANT
PACK VAGINAL MINOR WOMEN LF (CUSTOM PROCEDURE TRAY) ×5 IMPLANT
PAD DRESSING TELFA 3X8 NADH (GAUZE/BANDAGES/DRESSINGS) ×2 IMPLANT
PAD OB MATERNITY 4.3X12.25 (PERSONAL CARE ITEMS) ×5 IMPLANT
PROTECTOR NERVE ULNAR (MISCELLANEOUS) ×5 IMPLANT
SET TUBING HYSTEROSCOPY 2 NDL (TUBING) ×3 IMPLANT
SUT VICRYL 0 UR6 27IN ABS (SUTURE) ×3 IMPLANT
SUT VICRYL 4-0 PS2 18IN ABS (SUTURE) ×5 IMPLANT
SYR 30ML LL (SYRINGE) ×3 IMPLANT
SYR CONTROL 10ML LL (SYRINGE) ×5 IMPLANT
TOWEL OR 17X24 6PK STRL BLUE (TOWEL DISPOSABLE) ×10 IMPLANT
TROCAR XCEL NON-BLD 11X100MML (ENDOMECHANICALS) IMPLANT
TROCAR XCEL NON-BLD 5MMX100MML (ENDOMECHANICALS) ×6 IMPLANT
TUBE HYSTEROSCOPY W Y-CONNECT (TUBING) ×3 IMPLANT
WARMER LAPAROSCOPE (MISCELLANEOUS) ×5 IMPLANT
WATER STERILE IRR 1000ML POUR (IV SOLUTION) ×5 IMPLANT

## 2013-05-05 NOTE — Anesthesia Procedure Notes (Signed)
Procedure Name: Intubation Date/Time: 05/05/2013 7:37 AM Performed by: Flossie Dibble Pre-anesthesia Checklist: Suction available, Emergency Drugs available, Timeout performed, Patient identified and Patient being monitored Patient Re-evaluated:Patient Re-evaluated prior to inductionOxygen Delivery Method: Circle system utilized Preoxygenation: Pre-oxygenation with 100% oxygen Intubation Type: IV induction Ventilation: Mask ventilation without difficulty Laryngoscope Size: Mac and 3 Grade View: Grade I Tube size: 7.0 mm Number of attempts: 1 Airway Equipment and Method: Stylet Placement Confirmation: ETT inserted through vocal cords under direct vision and breath sounds checked- equal and bilateral Secured at: 21 cm Tube secured with: Tape Dental Injury: Teeth and Oropharynx as per pre-operative assessment

## 2013-05-05 NOTE — H&P (Signed)
Michelle Gross is an 33 y.o. female  G0 SWF with uterine polyps, menorrhagia, desire for future childbearing, and a hx of chronic pelvic pain here for hysteroscopy, polyp resection with D&C, as well as diagnostic laparoscopy and possible treatment of endometriosis.  Patient's surgery was scheduled in March but was rescheduled due to jury duty that I (her Psychologist, sport and exercise) was required to serve.  Pt's surgery was rescheduled due to her convenience.    Patient had an ultrasound 02/12/13 showing two fibroids 1.4 and 1.8cm as well as 80mm endometrial polyp.  We discussed treatment options including OCps and IUD use.  She is not interested in either.  The fibroids do not appear large enough to need removal at this time.  Therefore, polyp removal is reasonable.  Due to long hx of pelvic pain and severe cramping with cycles, we have discussed laparoscopy in the past.  She is desirous of proceeding with both today.    Risks and benefits were discussed in office note on 02/12/13.  These were reviewed with the patient today.  All questions answered.    Pertinent Gynecological History: Menses: lasts 7 days and is heavy with significant cramping Bleeding: normal except as above Contraception: abstinence DES exposure: denies Blood transfusions: none Sexually transmitted diseases: no past history Previous GYN Procedures: none  Last mammogram: n/a Date: n/a Last pap: normal Date: 01/12/13 OB History: G0, P0   Menstrual History: No LMP recorded.    Past Medical History  Diagnosis Date  . Thyroid disease     hypo  . Depression   . Hypothyroidism   . Anxiety   . Arthritis     hands/feet/knee  . Headache(784.0)     otc med prn    Past Surgical History  Procedure Laterality Date  . 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 Tonsillectomy    . Fracture surgery Left 1999    x 3, arm fracture  . Tympanostomy tube placement    . Wisdom tooth extraction      Family History  Problem  Relation Age of Onset  . Breast cancer Mother 13    DCIS, lumpectomy and chemo  . Cancer Sister 81    thyroid  . Diabetes Paternal Grandmother   . Cancer Paternal Grandfather     liver  . Thyroid disease Maternal Grandmother     hypo  . Migraines Father   . Migraines Sister   . Thyroid disease Sister     hypo  . Osteoporosis Maternal Grandmother     Social History:  reports that she has never smoked. She has never used smokeless tobacco. She reports that she drinks alcohol. She reports that she does not use illicit drugs.  Allergies:  Allergies  Allergen Reactions  . Codeine Nausea And Vomiting  . Neomycin     blisters  . Prednisone     Very sleepy  . Sulfa Antibiotics Hives    Childhood rxn    Prescriptions prior to admission  Medication Sig Dispense Refill  . Armodafinil (NUVIGIL) 150 MG tablet Take 75 mg by mouth daily as needed.      . benzonatate (TESSALON) 100 MG capsule Take 1 capsule (100 mg total) by mouth 3 (three) times daily as needed for cough.  21 capsule  0  . colesevelam (WELCHOL) 625 MG tablet Take 625 mg by mouth daily as needed.      . desvenlafaxine (PRISTIQ) 50 MG 24 hr tablet Take  1 tablet (50 mg total) by mouth daily.  90 tablet  3  . ibuprofen (ADVIL,MOTRIN) 200 MG tablet Take 400-800 mg by mouth every 6 (six) hours as needed for headache, moderate pain or cramping.      Marland Kitchen levothyroxine (SYNTHROID, LEVOTHROID) 50 MCG tablet Take 50 mcg by mouth daily before breakfast.      . oseltamivir (TAMIFLU) 75 MG capsule Take 1 capsule (75 mg total) by mouth every 12 (twelve) hours.  10 capsule  0  . oseltamivir (TAMIFLU) 75 MG capsule Take 1 capsule (75 mg total) by mouth every 12 (twelve) hours.  10 capsule  0    Review of Systems  All other systems reviewed and are negative.   Blood pressure 93/63, pulse 76, temperature 98.8 F (37.1 C), temperature source Oral, resp. rate 16, SpO2 100.00%. Physical Exam  Constitutional: She is oriented to person,  place, and time. She appears well-developed and well-nourished.  Cardiovascular: Normal rate and regular rhythm.   Respiratory: Effort normal and breath sounds normal.  GI: Soft. Bowel sounds are normal.  Neurological: She is alert and oriented to person, place, and time.  Skin: Skin is warm and dry.  Psychiatric: She has a normal mood and affect.    No results found for this or any previous visit (from the past 24 hour(s)).  No results found.  Assessment/Plan: 33 yo G0 SWF with hx of endometrial polyp, menorrhagia, dysmenorrhea, and pelvic pain here for hysteroscopy, polyp resection, D&C and diagnostic laparoscopy.  All questions answered.  Pt here and ready to proceed.  Lyman Speller 05/05/2013, 6:26 AM

## 2013-05-05 NOTE — Anesthesia Postprocedure Evaluation (Signed)
Anesthesia Post Note  Patient: Michelle Gross  Procedure(s) Performed: Procedure(s) (LRB): LAPAROSCOPY OPERATIVE (N/A) DILATATION AND CURETTAGE /HYSTEROSCOPY (N/A) ABLATION ON ENDOMETRIOSIS (N/A) BIOPSY (N/A)  Anesthesia type: General  Patient location: PACU  Post pain: Pain level controlled  Post assessment: Post-op Vital signs reviewed  Last Vitals:  Filed Vitals:   05/05/13 1000  BP:   Pulse:   Temp: 36.6 C  Resp:     Post vital signs: Reviewed  Level of consciousness: sedated  Complications: No apparent anesthesia complications

## 2013-05-05 NOTE — Anesthesia Preprocedure Evaluation (Signed)
Anesthesia Evaluation  Patient identified by MRN, date of birth, ID band Patient awake    Reviewed: Allergy & Precautions, H&P , NPO status , Patient's Chart, lab work & pertinent test results  Airway Mallampati: I TM Distance: >3 FB Neck ROM: full    Dental no notable dental hx. (+) Teeth Intact   Pulmonary neg pulmonary ROS,    Pulmonary exam normal       Cardiovascular negative cardio ROS      Neuro/Psych    GI/Hepatic negative GI ROS, Neg liver ROS,   Endo/Other  Hypothyroidism   Renal/GU negative Renal ROS     Musculoskeletal   Abdominal Normal abdominal exam  (+)   Peds  Hematology negative hematology ROS (+)   Anesthesia Other Findings   Reproductive/Obstetrics negative OB ROS                           Anesthesia Physical Anesthesia Plan  ASA: II  Anesthesia Plan: General   Post-op Pain Management:    Induction: Intravenous  Airway Management Planned: Oral ETT  Additional Equipment:   Intra-op Plan:   Post-operative Plan: Extubation in OR  Informed Consent: I have reviewed the patients History and Physical, chart, labs and discussed the procedure including the risks, benefits and alternatives for the proposed anesthesia with the patient or authorized representative who has indicated his/her understanding and acceptance.   Dental Advisory Given  Plan Discussed with: CRNA and Surgeon  Anesthesia Plan Comments:         Anesthesia Quick Evaluation

## 2013-05-05 NOTE — Discharge Instructions (Signed)
Post-surgical Instructions, Outpatient Surgery ° °You may expect to feel dizzy, weak, and drowsy for as long as 24 hours after receiving the medicine that made you sleep (anesthetic). For the first 24 hours after your surgery:   °· Do not drive a car, ride a bicycle, participate in physical activities, or take public transportation until you are done taking narcotic pain medicines or as directed by Dr. Elyce Zollinger.  °· Do not drink alcohol or take tranquilizers.  °· Do not take medicine that has not been prescribed by your physicians.  °· Do not sign important papers or make important decisions while on narcotic pain medicines.  °· Have a responsible person with you.  ° °CARE OF INCISION °· If you have a bandage, you may remove it in one day.  If there are steri-strips or dermabond, just let this loosen on its own.  °· You may shower on the first day after your surgery.  Do not sit in a tub bath for one week. °· Avoid heavy lifting (more than 10 pounds/4.5 kilograms), pushing, or pulling.  °· Avoid activities that may risk injury to your incisions.  ° °PAIN MANAGEMENT °· Motrin 800mg.  (This is the same as 4-200mg over the counter tablets of Motrin or ibuprofen.)  You may take this every eight hours or as needed for cramping.   °· Percocet 5/325mg.  For more severe pain, take one or two tablets every four to six hours as needed for pain control.  (Remember that narcotic pain medications increase your risk of constipation.  If this becomes a problem, you may take an over the counter stool softener like Colace 100mg up to four times a day.) ° °DO'S AND DON'T'S °· Do not take a tub bath for one week.  You may shower on the first day after your surgery °· Do not do any heavy lifting for one to two weeks.  This increases the chance of bleeding. °· Do move around as you feel able.  Stairs are fine.  You may begin to exercise again as you feel able.  Do not lift any weights for two weeks. °· Do not put anything in the vagina  for two weeks--no tampons, intercourse, or douching.   ° °REGULAR MEDIATIONS/VITAMINS: °· You may restart all of your regular medications as prescribed. °· You may restart all of your vitamins as you normally take them.   ° °PLEASE CALL OR SEEK MEDICAL CARE IF: °· You have persistent nausea and vomiting.  °· You have trouble eating or drinking.  °· You have an oral temperature above 100.5.  °· You have constipation that is not helped by adjusting diet or increasing fluid intake. Pain medicines are a common cause of constipation.  °· You have heavy vaginal bleeding °· You have redness or drainage from your incision(s) or there is increasing pain or tenderness near or in the surgical site.  ° ° ° °

## 2013-05-05 NOTE — Op Note (Signed)
05/05/2013  9:22 AM  PATIENT:  Michelle Gross  33 y.o. female G0 with menorrhagia, dysmenorrhea, chronic pelvic pain, possible endometrial polyp on ultrasound  PRE-OPERATIVE DIAGNOSIS:  Dysmenorrhea; Pelvic Pain; Menorrhagia  POST-OPERATIVE DIAGNOSIS:  Dysmenorrhea; Pelvic Pain; Menorrhagia; Questionable Endometriosis  PROCEDURE:  Procedure(s): LAPAROSCOPY OPERATIVE DILATATION AND CURETTAGE /HYSTEROSCOPY ABLATION ON ENDOMETRIOSIS BIOPSY  SURGEON:  Lyman Speller  ASSISTANTS: OR staff   ANESTHESIA:   general  ESTIMATED BLOOD LOSS: minimal  BLOOD ADMINISTERED:none   FLUIDS: 1800cc LR  UOP: 30 cc drained with I&O cath at beginning of procedure  SPECIMEN:  Endometrial curettings, peritoneal biopsy for endometriosis, right pedunculated ovarian cyst  DISPOSITION OF SPECIMEN:  PATHOLOGY  FINDINGS: fluffy endometrium without polyp, normal upper abdomen, pedunculated tissue off right ovary with possible endometriosis, endometriosis of right uterosacral ligament, normal appendix, normal tubes and ovaries, peritoneal cyst on posterior aspect of uterus, uterine fibroids  DESCRIPTION OF OPERATION: Patient was taken to the operating room.  She is placed in the supine position. SCDs were on her lower extremities and functioning properly. General anesthesia was administered without difficulty.   Legs were then placed in the [place] in the low lithotomy position. The legs were lifted to the high lithotomy position.  Chloro prep was used to prep the abdominal skin and Betadine prep was used on the inner thighs perineum and vagina x3. After three minutes, the patient was draped in a normal standard fashion. An in and out catheterization with a red rubber Foley catheter was performed. Approximately 30 cc of clear urine was noted. A bivalve speculum was placed the vagina. The anterior lip of the cervix was grasped with single-tooth tenaculum.  A paracervical block of 1% lidocaine mixed  one-to-one with epinephrine (1:100,000 units).  10 cc was used total. The cervix is dilated up to #21 [facility] dilators. The endometrial cavity sounded to 9 cm.   A 2.9 millimeter diagnostic hysteroscope was obtained. 1.5% glycine was used as a hysteroscopic fluid. The hysteroscope was advanced through the endocervical canal into the endometrial cavity.  The endometrium was very fluffy.  The tubal ostia could not be visualized due to lateral location within the cornual of the uterus.  There was no clear endometrial polyp.  The hysteroscope was removed. A #1 smooth curette was used to curette the cavity until rough grey texture was noted in all quadrants. There was a significant amount of tissue was was present with the D&C.  The procedure was ended at this point.  The fluid deficit of 1.5% glycine was 105 cc. The tenaculum was removed from the anterior lip of the cervix. An acorn uterine manipulator was placed in the cervical os and attached to the tenaculum as a means of manipulating the uterus.  The speculum was removed.  A second I&O cath was performed before moving to the abdomen.  Sterile gloves were changed.    Attention was turned to the abdomen. The umbilicus was anesthetized with .025% Marcaine. The skin was incised with a #11 blade. The abdomen was elevated and Veress needle was passed directly into the abdomen. The peritoneum was felt as a pop as it was passed with the needle. A syringe of normal saline was attached the needle and aspiration was performed. No blood or fluid was noted. Fluid was injected without difficulty and a second aspiration was performed. No fluid or blood or saline was noted. Fluid dripped easily into the needle. Low flow CO2 gas was attached the needle and the pneumoperitoneum was  achieved without difficulty. Once for 2.5L of gas was in the abdomen the Veress needle was removed and a 5 millimeter non-bladed optiview trochar and port, with the laparoscope inside, were passed  directly to the abdomen. The laparoscope was then used to confirm intraperitoneal placement. Inspection of upper abdomen was performed.  Normal findings were noted.  The patient was placed in trendelenburg positioning.   Ovaries and tubes were normal except for an abnormal appearing pedunculated cyst from the right ovary.  Also endometriosis on the right uterosacral ligament was noted.  Decision was made to place a LLQ port.  Port site location was chosen. The skin was transilluminated and the skin was anesthetized with ropivacaine mixture. 72mm skin incisions were made and 11mm nondisposable trocar and port was passed directly into the abdomen.   Using endoscopic scissors, the pedunculated right cyst was excised and sent for pathology.  There was a serosal appearing cyst on the uterus fundus which was also excised and discarded.  The endometriosis on the right uterosacral ligament was biopsied after the ureter as identified and was well above the location of the abnormal tissue.  This area did appear scarred and pulled due to the endometriosis.  This area was cauterized with monopolar cautery.  Area was irrigated.  No bleeding noted.  Biopsy was sent to pathology.    At this point, no bleeding was noted.  Procedure was ended.  LLQ port was removed under direct visualization of laparoscope.  Pt was taken out of trendelenburg positioning.  pneumoperitoneum was relieved.  The midline port was removed.  Instrument were removed from the vaginal with minimal bleeding noted.  Sponge, lap, needle, and instrument counts were correct x2. Legs were moved back to the supine position and she was awakened from anesthesia without difficulty.  Patient tolerated the procedure very well and was taken to the recovery room in stable condition.  COUNTS:  YES  PLAN OF CARE: Transfer to PACU

## 2013-05-05 NOTE — Transfer of Care (Signed)
Immediate Anesthesia Transfer of Care Note  Patient: Michelle Gross  Procedure(s) Performed: Procedure(s) with comments: LAPAROSCOPY OPERATIVE (N/A) DILATATION AND CURETTAGE /HYSTEROSCOPY (N/A) ABLATION ON ENDOMETRIOSIS (N/A) BIOPSY (N/A) - Peritoneal Wall  Patient Location: PACU  Anesthesia Type:General  Level of Consciousness: awake, alert  and oriented  Airway & Oxygen Therapy: Patient Spontanous Breathing and Patient connected to nasal cannula oxygen  Post-op Assessment: Report given to PACU RN and Post -op Vital signs reviewed and stable  Post vital signs: Reviewed and stable  Complications: No apparent anesthesia complications

## 2013-05-06 ENCOUNTER — Encounter (HOSPITAL_COMMUNITY): Payer: Self-pay | Admitting: Obstetrics & Gynecology

## 2013-05-07 ENCOUNTER — Telehealth: Payer: Self-pay | Admitting: Obstetrics & Gynecology

## 2013-05-07 NOTE — Telephone Encounter (Signed)
Left message for patient to call back. Need to advised of appt with Dr. Allyson Sabal, 05.28 @330 . Arrive 10 minutes early w/copay or consultation fee of $100.

## 2013-05-12 NOTE — Telephone Encounter (Signed)
Advised patient of appoinment 05/28. Patient agreeable.

## 2013-05-19 ENCOUNTER — Encounter: Payer: Self-pay | Admitting: Obstetrics & Gynecology

## 2013-05-19 ENCOUNTER — Ambulatory Visit (INDEPENDENT_AMBULATORY_CARE_PROVIDER_SITE_OTHER): Payer: BC Managed Care – PPO | Admitting: Obstetrics & Gynecology

## 2013-05-19 VITALS — BP 100/58 | HR 68 | Resp 16 | Wt 112.0 lb

## 2013-05-19 DIAGNOSIS — Z9889 Other specified postprocedural states: Secondary | ICD-10-CM

## 2013-05-19 MED ORDER — NORETHIN ACE-ETH ESTRAD-FE 1-20 MG-MCG PO TABS: 1.0000 | ORAL_TABLET | Freq: Every day | ORAL | Status: DC

## 2013-05-19 NOTE — Progress Notes (Signed)
Post Operative Visit  Procedure: laparoscopy, hysteroscopy/D&C Days Post-op: 15  Subjective: Doing well.  No pain.  Did had a cycle after the procedure.  This was really not that bad for her.  Pain and flow was better.  Had diarrhea for a few days post operatively.  She did not call and let me know this.  That has improved.  No fever.  Bladder function is normal.    Objective: BP 100/58  Pulse 68  Resp 16  Wt 112 lb (50.803 kg)  LMP 05/07/2013  EXAM General: alert and cooperative Resp: clear to auscultation bilaterally Cardio: regular rate and rhythm, S1, S2 normal, no murmur, click, rub or gallop GI: soft, non-tender; bowel sounds normal; no masses,  no organomegaly and incision: clean, dry and intact Extremities: extremities normal, atraumatic, no cyanosis or edema Vaginal Bleeding: none  Assessment: s/p hysteroscopy with polyp resection, laparoscopy with treatment of endometriosis  Plan: Follow up 3 months.  Pt knows to call with any new symptoms.  Hypertension, nausea, DVT, PE all discussed.

## 2013-06-29 ENCOUNTER — Encounter: Payer: Self-pay | Admitting: Obstetrics & Gynecology

## 2013-07-14 ENCOUNTER — Telehealth: Payer: Self-pay | Admitting: Emergency Medicine

## 2013-07-14 NOTE — Telephone Encounter (Signed)
Spoke with patient. She states that she did not dc her ocp but that the headaches resolved on their own. She is feeling good at this time. She will call back with any further concerns.   Routing to provider for final review. Patient agreeable to disposition. Will close encounter

## 2013-07-14 NOTE — Telephone Encounter (Signed)
Message copied by Michele Mcalpine on Tue Jul 14, 2013  3:15 PM ------      Message from: Megan Salon      Created: Mon Jul 13, 2013  6:07 AM      Regarding: MyChart message never read by pt       Michelle Gross,      Pt sent me a message through My Chart and never read it.  Would you call her and see how she is doing with headaches?  I advised her to stop her OCPs in the message to see if things improved.  Thank you.  Messages are below.  Thanks.            MSM             I think you should go ahead and stop it.  If it is causing the headaches, they will resolve.  If they continue, then we know it's not the pills.  I would change one thing at a time so when we have a new symptom or a symptom that resolves, then we know the cause.  How does that sound?  Give me some feedback in a few weeks.  Thanks.                Dr. Sabra Heck                ----- Message -----           From: Michelle Gross           Sent: 06/29/2013 11:06 AM EDT             To: MILLER, Satira Anis, MD        Subject: Visit Follow-Up Question                Dr. Sabra Heck,                You asked me to follow up with you on how the Junel Fe was working for me.  I have taken it for one month - started it on the first day of my last period; June 09, 2013.  It has not reduced severe pain and cramps during menstruation - the pain is back at its pre-surgery level.  I have been experiencing many more headaches than usual.  The headaches are likely to occur when I take the Nuvigil, but did not start until the first week of June.  Two of the headaches have been severe but not quite migraines.  Could the Junel  be causing the headaches?  Should I continue taking it and see if they stop?  Should I try another birth control pill?  I appreciate any advice you have on this.                Regards,        Michelle Gross. Michelle Gross        ------

## 2013-07-14 NOTE — Telephone Encounter (Signed)
Message copied by Michele Mcalpine on Tue Jul 14, 2013  3:13 PM ------      Message from: Megan Salon      Created: Mon Jul 13, 2013  6:07 AM      Regarding: MyChart message never read by pt       [name],      Pt sent me a message through My Chart and never read it.  Would you call her and see how she is doing with headaches?  I advised her to stop her OCPs in the message to see if things improved.  Thank you.  Messages are below.  Thanks.            MSM             I think you should go ahead and stop it.  If it is causing the headaches, they will resolve.  If they continue, then we know it's not the pills.  I would change one thing at a time so when we have a new symptom or a symptom that resolves, then we know the cause.  How does that sound?  Give me some feedback in a few weeks.  Thanks.                Dr. Sabra Heck                ----- Message -----           From: Serita Butcher           Sent: 06/29/2013 11:06 AM EDT             To: MILLER, Satira Anis, MD        Subject: Visit Follow-Up Question                Dr. Sabra Heck,                You asked me to follow up with you on how the Junel Fe was working for me.  I have taken it for one month - started it on the first day of my last period; June 09, 2013.  It has not reduced severe pain and cramps during menstruation - the pain is back at its pre-surgery level.  I have been experiencing many more headaches than usual.  The headaches are likely to occur when I take the Nuvigil, but did not start until the first week of June.  Two of the headaches have been severe but not quite migraines.  Could the Junel  be causing the headaches?  Should I continue taking it and see if they stop?  Should I try another birth control pill?  I appreciate any advice you have on this.                Regards,        Michelle Gross. Raford Pitcher        ------

## 2014-01-18 ENCOUNTER — Ambulatory Visit (INDEPENDENT_AMBULATORY_CARE_PROVIDER_SITE_OTHER): Payer: BLUE CROSS/BLUE SHIELD | Admitting: Nurse Practitioner

## 2014-01-18 ENCOUNTER — Encounter: Payer: Self-pay | Admitting: Nurse Practitioner

## 2014-01-18 VITALS — BP 108/60 | HR 84 | Resp 16 | Ht 64.0 in | Wt 117.0 lb

## 2014-01-18 DIAGNOSIS — Z01419 Encounter for gynecological examination (general) (routine) without abnormal findings: Secondary | ICD-10-CM

## 2014-01-18 DIAGNOSIS — Z0000 Encounter for general adult medical examination without abnormal findings: Secondary | ICD-10-CM

## 2014-01-18 DIAGNOSIS — Z113 Encounter for screening for infections with a predominantly sexual mode of transmission: Secondary | ICD-10-CM

## 2014-01-18 LAB — POCT URINALYSIS DIPSTICK
Blood, UA: NEGATIVE
GLUCOSE UA: NEGATIVE
Ketones, UA: NEGATIVE
LEUKOCYTES UA: NEGATIVE
Nitrite, UA: NEGATIVE
PH UA: 5
Protein, UA: NEGATIVE
Urobilinogen, UA: NEGATIVE

## 2014-01-18 MED ORDER — DESVENLAFAXINE SUCCINATE ER 50 MG PO TB24: 50.0000 mg | ORAL_TABLET | Freq: Every day | ORAL | Status: DC

## 2014-01-18 MED ORDER — NORETHIN ACE-ETH ESTRAD-FE 1-20 MG-MCG PO TABS: 1.0000 | ORAL_TABLET | Freq: Every day | ORAL | Status: DC

## 2014-01-18 NOTE — Progress Notes (Signed)
34 y.o. G0P0 Single  Caucasian Fe here for annual exam.  Patient had laparoscopic with D& C with ablation of endometriosis 05/05/13 for dysmenorrhea and pelvic pain.  Started wit OCP in June.  Initially a lot of HA's  Then got better.  HA again in December with missed pills X 2.  Did get a BTB along with the HA.  Lasted 3-4 days. Treated with OTC NSAID's.  Went to [place] and [place] for work.  Patient's last menstrual period was 01/11/2014.          Sexually active: Yes.    The current method of family planning is OCP (estrogen/progesterone).    Exercising: Yes.    Walking, jogging 3 x weekly Smoker:  no  Health Maintenance: Pap:  01/12/13 Neg. 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 She has never used smokeless tobacco. She reports that she drinks about 0.6 oz of alcohol per week. She reports that she does not use illicit drugs.  Past Medical History  Diagnosis Date  . Thyroid disease     hypo  . Depression   . Hypothyroidism   . Anxiety   . Arthritis     hands/feet/knee  . Headache(784.0)     otc med prn    Past Surgical History  Procedure Laterality Date  . 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 Tonsillectomy    . Fracture surgery Left 1999    x 3, arm fracture  . Tympanostomy tube placement    . Wisdom tooth extraction    . Laparoscopy N/A 05/05/2013    Procedure: LAPAROSCOPY OPERATIVE;  Surgeon: Lyman Speller, MD;  Location: [facility];  Service: Gynecology;  Laterality: N/A;  . Hysteroscopy w/d&c N/A 05/05/2013    Procedure: DILATATION AND CURETTAGE /HYSTEROSCOPY;  Surgeon: Lyman Speller, MD;  Location: [facility];  Service: Gynecology;  Laterality: N/A;  . Ablation on endometriosis N/A 05/05/2013    Procedure: ABLATION ON ENDOMETRIOSIS;  Surgeon: Lyman Speller, MD;  Location: [facility];  Service: Gynecology;  Laterality: N/A;  . Esophageal biopsy N/A 05/05/2013     Procedure: BIOPSY;  Surgeon: Lyman Speller, MD;  Location: [facility];  Service: Gynecology;  Laterality: N/A;  Peritoneal Wall    Current Outpatient Prescriptions  Medication Sig Dispense Refill  . Armodafinil (NUVIGIL) 150 MG tablet Take 75 mg by mouth daily as needed.    . colesevelam (WELCHOL) 625 MG tablet Take 625 mg by mouth daily as needed.    . desvenlafaxine (PRISTIQ) 50 MG 24 hr tablet Take 1 tablet (50 mg total) by mouth daily. 90 tablet 3  . etodolac (LODINE) 400 MG tablet Take 400 mg by mouth 2 (two) times daily.  3  . ibuprofen (ADVIL,MOTRIN) 200 MG tablet Take 400-800 mg by mouth every 6 (six) hours as needed for headache, moderate pain or cramping.    Marland Kitchen levothyroxine (SYNTHROID, LEVOTHROID) 50 MCG tablet Take 50 mcg by mouth daily before breakfast.    . norethindrone-ethinyl estradiol (JUNEL FE,GILDESS FE,LOESTRIN FE) 1-20 MG-MCG tablet Take 1 tablet by mouth daily. 1 Package 12   No current facility-administered medications for this visit.    @FAM  HX@  ROS:  Pertinent items are noted in HPI.  Otherwise, a comprehensive ROS was negative.  Exam:   BP 108/60 mmHg  Pulse 84  Resp 16  Ht 5\' 4"  (1.626  m)  Wt 117 lb (53.071 kg)  BMI 20.07 kg/m2  LMP 01/11/2014 Height: 5\' 4"  (162.6 cm) @LAST  HT(3)@  General appearance: alert, cooperative and appears stated age Head: Normocephalic, without obvious abnormality, atraumatic Neck: no adenopathy, supple, symmetrical, trachea midline and thyroid normal to inspection and palpation Lungs: clear to auscultation bilaterally Breasts: normal appearance, no masses or tenderness Heart: regular rate and rhythm Abdomen: soft, non-tender; no masses,  no organomegaly Extremities: extremities normal, atraumatic, no cyanosis or edema Skin: Skin color, texture, turgor normal. No rashes or lesions Lymph nodes: Cervical, supraclavicular, and axillary nodes normal. No abnormal inguinal nodes palpated Neurologic: Grossly normal   Pelvic:  External genitalia:  no lesions              Urethra:  normal appearing urethra with no masses, tenderness or lesions              Bartholin's and Skene's: normal                 Vagina: normal appearing vagina with normal color and discharge, no lesions              Cervix: anteverted              Pap taken: Yes.   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 Wilson Singer  P:   Reviewed health and wellness pertinent to exam  Pap smear taken today  Refill on Loestrin Fe 1/20 for a year  Continue to monitor HA's  Refill on Pristiq  Counseled on breast self exam, STD prevention, use and side effects of OCP's, adequate intake of calcium and vitamin D, diet and exercise return annually or prn  An After Visit Summary was printed and given to the patient.

## 2014-01-18 NOTE — Progress Notes (Deleted)
34 y.o. G0P0 Single Caucasian Fe here for annual exam.    No LMP recorded.          Sexually active: {yes no:314532}  The current method of family planning is {contraception:315051}.    Exercising: {yes no:314532}  {types:19826} Smoker:  {YES NO:22349}  Health Maintenance: Pap:  *** MMG:  *** Colonoscopy:  *** BMD:   *** TDaP:  *** Labs: ***   reports that she has never smoked. She has never used smokeless tobacco. She reports that she drinks alcohol. She reports that she does not use illicit drugs.  Past Medical History  Diagnosis Date  . Thyroid disease     hypo  . Depression   . Hypothyroidism   . Anxiety   . Arthritis     hands/feet/knee  . Headache(784.0)     otc med prn    Past Surgical History  Procedure Laterality Date  . 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 Tonsillectomy    . Fracture surgery Left 1999    x 3, arm fracture  . Tympanostomy tube placement    . Wisdom tooth extraction    . Laparoscopy N/A 05/05/2013    Procedure: LAPAROSCOPY OPERATIVE;  Surgeon: Lyman Speller, MD;  Location: [facility];  Service: Gynecology;  Laterality: N/A;  . Hysteroscopy w/d&c N/A 05/05/2013    Procedure: DILATATION AND CURETTAGE /HYSTEROSCOPY;  Surgeon: Lyman Speller, MD;  Location: [facility];  Service: Gynecology;  Laterality: N/A;  . Ablation on endometriosis N/A 05/05/2013    Procedure: ABLATION ON ENDOMETRIOSIS;  Surgeon: Lyman Speller, MD;  Location: [facility];  Service: Gynecology;  Laterality: N/A;  . Esophageal biopsy N/A 05/05/2013    Procedure: BIOPSY;  Surgeon: Lyman Speller, MD;  Location: [facility];  Service: Gynecology;  Laterality: N/A;  Peritoneal Wall    Current Outpatient Prescriptions  Medication Sig Dispense Refill  . Armodafinil (NUVIGIL) 150 MG tablet Take 75 mg by mouth daily as needed.    . colesevelam (WELCHOL) 625 MG tablet Take 625 mg by mouth daily as needed.    . desvenlafaxine (PRISTIQ) 50 MG 24 hr tablet  Take 1 tablet (50 mg total) by mouth daily. 90 tablet 3  . ibuprofen (ADVIL,MOTRIN) 200 MG tablet Take 400-800 mg by mouth every 6 (six) hours as needed for headache, moderate pain or cramping.    Marland Kitchen levothyroxine (SYNTHROID, LEVOTHROID) 50 MCG tablet Take 50 mcg by mouth daily before breakfast.    . norethindrone-ethinyl estradiol (JUNEL FE,GILDESS FE,LOESTRIN FE) 1-20 MG-MCG tablet Take 1 tablet by mouth daily. 1 Package 12   No current facility-administered medications for this visit.    @FAM  HX@  ROS:  Pertinent items are noted in HPI.  Otherwise, a comprehensive ROS was negative.  Exam:   There were no vitals taken for this visit.   @LAST  HT(3)@  General appearance: alert, cooperative and appears stated age Head: Normocephalic, without obvious abnormality, atraumatic Neck: no adenopathy, supple, symmetrical, trachea midline and thyroid {EXAM; THYROID:18604} Lungs: clear to auscultation bilaterally Breasts: {Exam; breast:13139::"normal appearance, no masses or tenderness"} Heart: regular rate and rhythm Abdomen: soft, non-tender; no masses,  no organomegaly Extremities: extremities normal, atraumatic, no cyanosis or edema Skin: Skin color, texture, turgor normal. No rashes or lesions Lymph nodes: Cervical, supraclavicular, and axillary nodes normal. No abnormal inguinal nodes palpated Neurologic: Grossly normal   Pelvic: External genitalia:  no lesions  Urethra:  normal appearing urethra with no masses, tenderness or lesions              Bartholin's and Skene's: normal                 Vagina: normal appearing vagina with normal color and discharge, no lesions              Cervix: {exam; cervix:14595}              Pap taken: {yes no:314532} Bimanual Exam:  Uterus:  {exam; uterus:12215}              Adnexa: {exam; adnexa:12223}               Rectovaginal: Confirms               Anus:  normal sphincter tone, no lesions  Chaperone present: Yes/NO  A:  Well Woman  with normal exam  P:   Reviewed health and wellness pertinent to exam  Pap smear taken today  {plan; gyn:5269::"mammogram","pap smear","return annually or prn"}  An After Visit Summary was printed and given to the patient.

## 2014-01-18 NOTE — Patient Instructions (Signed)

## 2014-01-19 LAB — HEMOGLOBIN, FINGERSTICK: Hemoglobin, fingerstick: 13.9 g/dL (ref 12.0–16.0)

## 2014-01-19 LAB — STD PANEL
HIV 1&2 Ab, 4th Generation: NONREACTIVE
Hepatitis B Surface Ag: NEGATIVE

## 2014-01-19 NOTE — Progress Notes (Signed)
Encounter reviewed by Dr. Brook Silva.  

## 2014-01-21 LAB — IPS N GONORRHOEA AND CHLAMYDIA BY PCR

## 2014-01-22 LAB — IPS PAP TEST WITH HPV

## 2014-03-03 ENCOUNTER — Encounter (HOSPITAL_COMMUNITY): Payer: Self-pay | Admitting: Neurology

## 2014-03-03 ENCOUNTER — Emergency Department (HOSPITAL_COMMUNITY)
Admission: EM | Admit: 2014-03-03 | Discharge: 2014-03-03 | Disposition: A | Discharge status: Home / Self Care | Payer: Worker's Compensation | Attending: Emergency Medicine | Admitting: Emergency Medicine

## 2014-03-03 DIAGNOSIS — S0992XA Unspecified injury of nose, initial encounter: Secondary | ICD-10-CM | POA: Diagnosis not present

## 2014-03-03 DIAGNOSIS — X12XXXA Contact with other hot fluids, initial encounter: Secondary | ICD-10-CM | POA: Diagnosis not present

## 2014-03-03 DIAGNOSIS — Y9289 Other specified places as the place of occurrence of the external cause: Secondary | ICD-10-CM | POA: Insufficient documentation

## 2014-03-03 DIAGNOSIS — Y990 Civilian activity done for income or pay: Secondary | ICD-10-CM | POA: Diagnosis not present

## 2014-03-03 DIAGNOSIS — F419 Anxiety disorder, unspecified: Secondary | ICD-10-CM | POA: Insufficient documentation

## 2014-03-03 DIAGNOSIS — Y9389 Activity, other specified: Secondary | ICD-10-CM | POA: Diagnosis not present

## 2014-03-03 DIAGNOSIS — Z793 Long term (current) use of hormonal contraceptives: Secondary | ICD-10-CM | POA: Diagnosis not present

## 2014-03-03 DIAGNOSIS — Z791 Long term (current) use of non-steroidal anti-inflammatories (NSAID): Secondary | ICD-10-CM | POA: Insufficient documentation

## 2014-03-03 DIAGNOSIS — H16001 Unspecified corneal ulcer, right eye: Secondary | ICD-10-CM | POA: Diagnosis not present

## 2014-03-03 DIAGNOSIS — F329 Major depressive disorder, single episode, unspecified: Secondary | ICD-10-CM | POA: Diagnosis not present

## 2014-03-03 DIAGNOSIS — T542X1A Toxic effect of corrosive acids and acid-like substances, accidental (unintentional), initial encounter: Secondary | ICD-10-CM | POA: Diagnosis not present

## 2014-03-03 DIAGNOSIS — S0993XA Unspecified injury of face, initial encounter: Secondary | ICD-10-CM | POA: Insufficient documentation

## 2014-03-03 DIAGNOSIS — M159 Polyosteoarthritis, unspecified: Secondary | ICD-10-CM | POA: Insufficient documentation

## 2014-03-03 DIAGNOSIS — T2660XA Corrosion of cornea and conjunctival sac, unspecified eye, initial encounter: Secondary | ICD-10-CM | POA: Diagnosis not present

## 2014-03-03 DIAGNOSIS — T2611XA Burn of cornea and conjunctival sac, right eye, initial encounter: Secondary | ICD-10-CM | POA: Diagnosis present

## 2014-03-03 DIAGNOSIS — Z79899 Other long term (current) drug therapy: Secondary | ICD-10-CM | POA: Diagnosis not present

## 2014-03-03 DIAGNOSIS — E039 Hypothyroidism, unspecified: Secondary | ICD-10-CM | POA: Diagnosis not present

## 2014-03-03 MED ORDER — TETRACAINE HCL 0.5 % OP SOLN
1.0000 [drp] | Freq: Once | OPHTHALMIC | Status: AC
  Administered 2014-03-03: 1 [drp] via OPHTHALMIC
  Filled 2014-03-03: qty 2

## 2014-03-03 MED ORDER — FLUORESCEIN SODIUM 1 MG OP STRP
1.0000 | ORAL_STRIP | Freq: Once | OPHTHALMIC | Status: AC
  Administered 2014-03-03: 1 via OPHTHALMIC
  Filled 2014-03-03: qty 1

## 2014-03-03 MED ORDER — FENTANYL CITRATE 0.05 MG/ML IJ SOLN
50.0000 ug | Freq: Once | INTRAMUSCULAR | Status: AC
Start: 2014-03-03 — End: 2014-03-03
  Administered 2014-03-03: 50 ug via INTRAVENOUS

## 2014-03-03 MED ORDER — OXYCODONE-ACETAMINOPHEN 5-325 MG PO TABS: 2.0000 | ORAL_TABLET | ORAL | Status: DC | PRN

## 2014-03-03 MED ORDER — KETOROLAC TROMETHAMINE 30 MG/ML IJ SOLN
30.0000 mg | Freq: Once | INTRAMUSCULAR | Status: AC
  Administered 2014-03-03: 30 mg via INTRAVENOUS
  Filled 2014-03-03: qty 1

## 2014-03-03 MED ORDER — CIPROFLOXACIN HCL 0.3 % OP SOLN
2.0000 [drp] | OPHTHALMIC | Status: DC
  Administered 2014-03-03: 2 [drp] via OPHTHALMIC
  Filled 2014-03-03: qty 2.5

## 2014-03-03 MED ORDER — FENTANYL CITRATE 0.05 MG/ML IJ SOLN
INTRAMUSCULAR | Status: AC
  Administered 2014-03-03: 50 ug via INTRAVENOUS
  Filled 2014-03-03: qty 2

## 2014-03-03 MED ORDER — FENTANYL CITRATE 0.05 MG/ML IJ SOLN
50.0000 ug | Freq: Once | INTRAMUSCULAR | Status: AC
  Administered 2014-03-03: 50 ug via INTRAVENOUS
  Filled 2014-03-03: qty 2

## 2014-03-03 NOTE — ED Notes (Signed)
Per EMS- Pt was at work at [employer], glass vial fell on the floor and acetic anhydride splashed in her eye. She was wearing safety glasses. Prior to EMS arrival pt washed eyes for 30 mins in water wash as well as bicarb flush. Has blistering to lips and eyelids. Right eye more painful. EMS flushed eyes with 2 L. Given 10 mg morphine. Pt reported she could see EMS. BP 173/90, NSR, 98% RA.

## 2014-03-03 NOTE — ED Notes (Signed)
Pt a/o x 4 on d/c with steady gait. Pt taken out in wheelchair.

## 2014-03-03 NOTE — ED Notes (Signed)
2nd NS infusing into right eye via morgan lens. PH 6.0.

## 2014-03-03 NOTE — Discharge Instructions (Signed)
Call Dr. Katy Fitch in the morning. Tell the office that the ER doctor spoke with Dr. Carolynn Sayers, and you are to be seen tomorrow.  Corneal Ulcer A corneal ulcer is an open sore on the cornea. The cornea is the clear covering at the front and center of the eye.  CAUSES  Most corneal ulcers are caused by infection, but there are other causes as well.  Bacterial infection. A bacterial infection can occur and cause a corneal ulcer if:  Contact lenses are worn too long (especially overnight) or are not properly cared for.  An eye injury occurs, allowing bacteria to infect the area of injury.  Viral infection. A viral infection can occur and cause a corneal ulcer if:  The eye becomes infected with a virus, such as the herpes simplex (cold sore) virus, chickenpox virus, or shingles virus.  Fungal infection. A fungal infection can occur and cause a corneal ulcer if:  An eye injury resulted from contact with a plant or plant material.  An anti-inflammatory eye drop is overused.  You have a weakened immune system.  Contact lenses are improperly cared for or become infected.  Foreign bodies in the eye, such as sand, glass, or small pieces of glass or metal.  Dry eyes.  Certain disorders that prevent eyelids from closing completely, such as Bell's palsy.  Contact lenses, especially extended-wear soft contact lenses. Contact lenses can:  Scratch the cornea's surface, allowing bacteria to enter the scratch.  Trap dirt underneath the contact lens, which can scratch the cornea.  Harbor bacteria and fungi, making it more likely for bacterial infections to occur.  Block oxygen from the cornea, making it more likely for infections to occur. SYMPTOMS   Eye pain that is often severe.  Blurry vision.  Light sensitivity.  Pus or thick discharge coming from your eye.  Eye redness.  Feeling like something is in your eye.  Watery or itchy eye.  Burning or stinging feeling. Some ulcers that  are very big may be seen as a white spot on the cornea. DIAGNOSIS  An eye exam will be performed. Your health care provider may use a special kind of microscope (slit lamp) to look at the cornea. Eye drops may be put into the eye to make the ulcer easier to see. If it is suspected that an infection caused the corneal ulcer, tissue samples or cultures from the eye may be taken. Numbing eye drops will be given before any samples or cultures are taken. The samples or cultures will be examined in the lab to check for bacteria, viruses, or fungi. TREATMENT  Treatment of the corneal ulcer depends on the cause. If your ulcer is severe, you may be given antibiotic eye drops up until your health care provider knows the test results. Other treatments can include:  Antibacterial, antiviral, or antifungal eye drops or ointment.  Removing the foreign body that caused the eye injury.  Artificial tears or a bandage contact lens if severe dry eyes caused the corneal ulcer.  Over-the-counter or prescription pain medicine.  Steroidal eye drops if the eye is inflamed and swollen.  Antibiotic medicines by mouth.  An injection of medicine under the thin membrane covering the eyeball (conjunctiva). This allows medicine to reach the ulcer in high doses.  Eye patching to reduce irritation from blinking and bright light. An eye patch may not be given if the ulcer was caused by a bacterial infection. If the corneal ulcer causes a scar on the cornea that  interferes with vision, hospitalization and surgery may be needed to replace the cornea (corneal transplant). HOME CARE INSTRUCTIONS   If prescribed, use your antibiotic pills, eye drops, or ointment as directed. Continue using them even if you start to feel better. You may have to apply eye drops as often as every few minutes to every hour, for days. It may be necessary to set your alarm clock every few minutes to every hour during the night. This is absolutely  necessary.  Only take over-the-counter or prescription medicines as directed by your health care provider.  Apply artificial tears as needed if you have dry eyes.  Do not touch or rub your eye, because this may increase the irritation and spread the infection.  Avoid wearing eye makeup.  Stay in a dark room and use sunglasses if you have light sensitivity.  Apply cool packs to your eye to relieve discomfort and swelling.  If your eye is patched, you should not drive or use machinery. You will have reduced side vision and ability to judge distance.  Do not drive or operate machinery until approved by your health care provider. Your ability to judge distances is impaired.  Follow up with your health care provider as directed.  Do not wear contact lenses until your health care provider approves. If you normally wear contact lenses, follow these general rules to avoid the risk of a corneal ulcer:  Do not wear contact lenses while you sleep.  Wash your hands before removing contact lenses.  Properly sterilize and store your contact lenses.  Regularly clean your contact lens case.  Do not use your saliva or tap water to clean or wet your contact lenses.  Remove your contact lenses if your eye becomes irritated. You may put them back in once your eyes feel better. SEEK IMMEDIATE MEDICAL CARE IF:   You notice a change in your vision.  Your pain is getting worse, not better.  You have increasing discharge from the eye. MAKE SURE YOU:   Understand these instructions.  Will watch your condition.  Will get help right away if you are not doing well or get worse. Document Released: 02/02/2004 Document Revised: 08/27/2012 Document Reviewed: 05/27/2012 [facility] Patient Information 2015 [place], [place]. This information is not intended to replace advice given to you by your health care provider. Make sure you discuss any questions you have with your health care provider.

## 2014-03-03 NOTE — ED Notes (Signed)
MORGAN LENS APPLIED TO RIGHT EYE. 1 L INFUSING. Ph 6.0 TO RIGHT EYE
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
## 2014-04-26 ENCOUNTER — Telehealth: Payer: Self-pay | Admitting: Nurse Practitioner

## 2014-04-26 NOTE — Telephone Encounter (Signed)
Left message regarding upcoming appointment has been canceled and needs to be rescheduled. °

## 2014-12-17 ENCOUNTER — Telehealth: Payer: Self-pay | Admitting: Obstetrics & Gynecology

## 2014-12-17 NOTE — Telephone Encounter (Signed)
Left message to call Michelle Gross at [phone]. 

## 2014-12-17 NOTE — Telephone Encounter (Addendum)
Patient says she started bleeding yesterday it is not heavy but is more than spotting, she is on Junel FE 1/20 . She said she had this problem last time and she had to have surgery. Patient wants to make sure nothing is wrong. Best # to reach patient [phone] (Chart given to triage)

## 2014-12-17 NOTE — Telephone Encounter (Signed)
I think it is ok to monitor her symptoms.  Would be unusual for a polyp to occur so quickly.  I'm fine having her come in for an ultrasound if that would make her less anxious.  Either way is fine.  She should give an update, if she decides to monitor, after she finishes this pack of pills.

## 2014-12-17 NOTE — Telephone Encounter (Signed)
Spoke with patient. Advised of message as seen below from [place]. Patient states that she would like to monitor her symptoms during this pack of pills and return call with an update.  Routing to provider for final review. Patient agreeable to disposition. Will close encounter.

## 2014-12-17 NOTE — Telephone Encounter (Signed)
Spoke with patient. Patient states that she is currently taking Junel Fe 1-20. Is on her second week of her pack and has been bleeding for 3 days. States that her bleeding is "like a period." Patient missed a pill 2 weeks ago. Doubled her pills the next day. Patient is concerned stating this happened last year and she needed to have surgery to have a polyp removed. Surgery was performed on 05/05/2013 with Dr.Miller. Is also experiencing light cramping and pelvic pressure. Patient was last seen for aex on 01/18/2014 with Kem Boroughs, FNP. Patient is concerned if she needs follow up or if she is okay to monitor her symptoms. Advised I will speak with Dr.Miller regarding her recommendations and return call.

## 2015-01-20 ENCOUNTER — Ambulatory Visit: Payer: BLUE CROSS/BLUE SHIELD | Admitting: Nurse Practitioner

## 2015-01-20 ENCOUNTER — Telehealth: Payer: Self-pay | Admitting: Nurse Practitioner

## 2015-01-20 ENCOUNTER — Telehealth: Payer: Self-pay

## 2015-01-20 NOTE — Telephone Encounter (Signed)
Patient returned call

## 2015-01-20 NOTE — Telephone Encounter (Signed)
Spoke with patient. Advised of PA approval for Pristiq effective 01/19/2015 through 01/07/2038. Patient is agreeable and verbalizes understanding.  Routing to provider for final review. Patient agreeable to disposition. Will close encounter.

## 2015-01-20 NOTE — Telephone Encounter (Signed)
Patient calling to check the status of a prior auth request for the Pristiq 50 mg.

## 2015-01-20 NOTE — Telephone Encounter (Signed)
Left message to call [place] at [phone].  Need to advise patient of PA approval for Pristiq effective from 01/19/2015 through 01/07/2038.

## 2015-01-20 NOTE — Telephone Encounter (Signed)
Spoke with patient. Advised PA has been submitted to her insurance on 01/19/2015. Awaiting to hear back from her insurance company regarding approval or denial. Advised this can take up to 72 hours. Patient states that she will run out of her medication on Saturday 01/22/2015. States she has spoken with her pharmacy and will pay OOP cost for a few days supply if PA has not been processed prior to Saturday. Advised I will notify her of PA approval or denial as soon as we are contacted by the insurance company. Patient is agreeable.  Routing to provider for final review. Patient agreeable to disposition. Will close encounter.

## 2015-02-02 ENCOUNTER — Ambulatory Visit (INDEPENDENT_AMBULATORY_CARE_PROVIDER_SITE_OTHER): Payer: BLUE CROSS/BLUE SHIELD | Admitting: Nurse Practitioner

## 2015-02-02 ENCOUNTER — Encounter: Payer: Self-pay | Admitting: Nurse Practitioner

## 2015-02-02 VITALS — BP 116/80 | HR 84 | Ht 64.25 in | Wt 117.0 lb

## 2015-02-02 DIAGNOSIS — Z0000 Encounter for general adult medical examination without abnormal findings: Secondary | ICD-10-CM

## 2015-02-02 DIAGNOSIS — Z113 Encounter for screening for infections with a predominantly sexual mode of transmission: Secondary | ICD-10-CM | POA: Diagnosis not present

## 2015-02-02 DIAGNOSIS — Z01419 Encounter for gynecological examination (general) (routine) without abnormal findings: Secondary | ICD-10-CM | POA: Diagnosis not present

## 2015-02-02 LAB — POCT URINALYSIS DIPSTICK
BILIRUBIN UA: NEGATIVE
Blood, UA: NEGATIVE
Glucose, UA: NEGATIVE
KETONES UA: NEGATIVE
LEUKOCYTES UA: NEGATIVE
Nitrite, UA: NEGATIVE
Protein, UA: NEGATIVE
Urobilinogen, UA: NEGATIVE
pH, UA: 6

## 2015-02-02 MED ORDER — DESVENLAFAXINE SUCCINATE ER 50 MG PO TB24: 50.0000 mg | ORAL_TABLET | Freq: Every day | ORAL | Status: DC

## 2015-02-02 MED ORDER — NORETHIN ACE-ETH ESTRAD-FE 1-20 MG-MCG PO TABS: 1.0000 | ORAL_TABLET | Freq: Every day | ORAL | Status: AC
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
## 2015-02-02 NOTE — Patient Instructions (Signed)

## 2015-02-03 LAB — STD PANEL
HIV 1&2 Ab, 4th Generation: NONREACTIVE
Hepatitis B Surface Ag: NEGATIVE

## 2015-02-03 LAB — HEMOGLOBIN, FINGERSTICK: Hemoglobin, fingerstick: 13.9 g/dL (ref 12.0–16.0)

## 2015-02-04 NOTE — Progress Notes (Signed)
Reviewed personally.  M. Suzanne Skiler Tye, MD.  

## 2015-02-06 NOTE — Addendum Note (Signed)
Addended by: Megan Salon on: 02/06/2015 09:47 PM   Modules accepted: Miquel Dunn

## 2015-02-07 LAB — IPS N GONORRHOEA AND CHLAMYDIA BY PCR

## 2015-02-07 LAB — IPS PAP TEST WITH REFLEX TO HPV

## 2015-02-17 ENCOUNTER — Other Ambulatory Visit: Payer: Self-pay | Admitting: Nurse Practitioner

## 2015-04-14 ENCOUNTER — Other Ambulatory Visit: Payer: Self-pay | Admitting: Nurse Practitioner

## 2015-04-14 NOTE — Telephone Encounter (Signed)
Patient would like a refill on Pristiq sent to [facility] on [address] at [phone]. She can't get an appointment with her pcp right away and she's afraid she may run out.

## 2015-04-14 NOTE — Telephone Encounter (Signed)
Medication refill request: Pristiq Last AEX:  02-02-15  Next AEX: 02-08-16 Last MMG (if hormonal medication request): N/A Refill authorized: please advise   Patient would like a refill on Pristiq [facility] to [facility] [address] at [phone]. She can't get an appointment with her pcp right away and she's afraid she may run out.

## 2015-04-15 MED ORDER — DESVENLAFAXINE SUCCINATE ER 50 MG PO TB24: 50.0000 mg | ORAL_TABLET | Freq: Every day | ORAL | Status: DC

## 2015-04-18 NOTE — Telephone Encounter (Signed)
Left message for pt to call back. Wanted to let pt know rx was sent to pharmacy

## 2015-04-18 NOTE — Telephone Encounter (Signed)
Left message stating rx was sent to the pharmacy & to call with any specific questions.

## 2015-07-05 IMAGING — CR DG CHEST 2V
2 series · 2 of 2 positions shown · non-contrast
Comparison: 11/10/2009

CLINICAL DATA: Cough, fever

EXAM:
CHEST  2 VIEW

[view not recorded (1 of 2)]
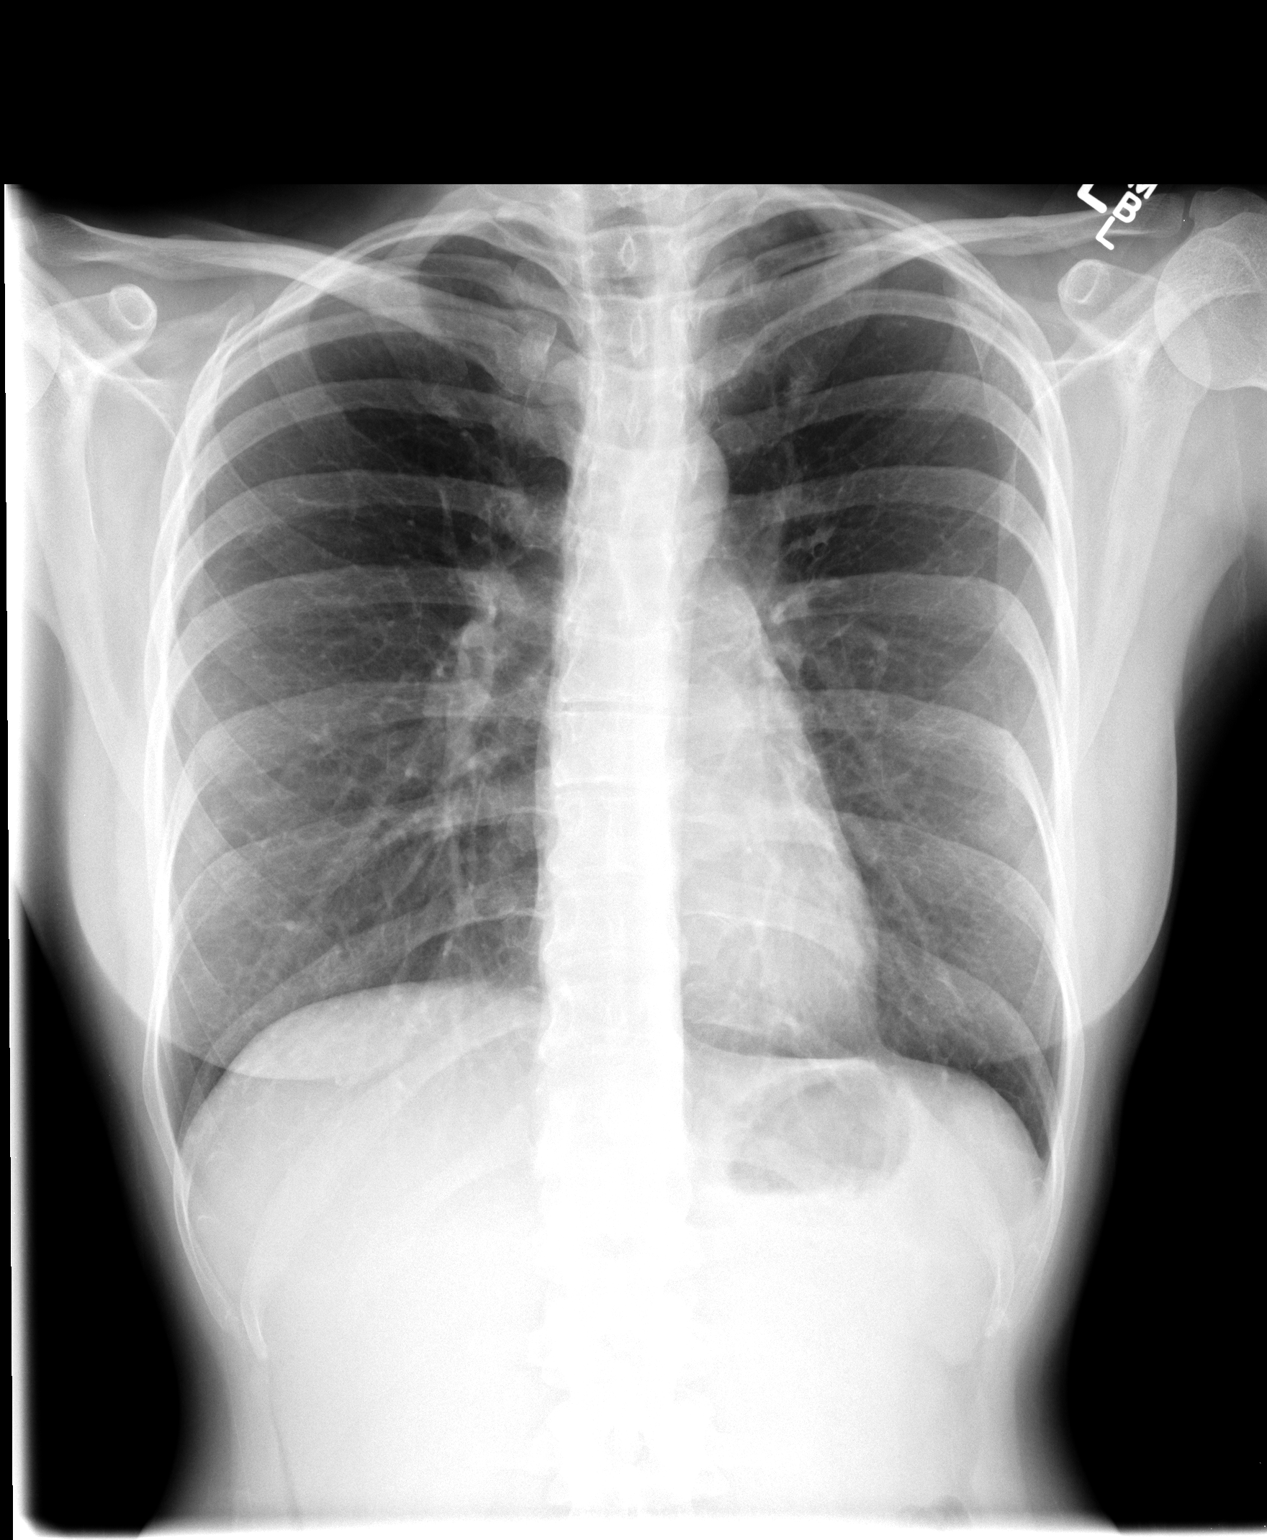

[view not recorded (2 of 2)]
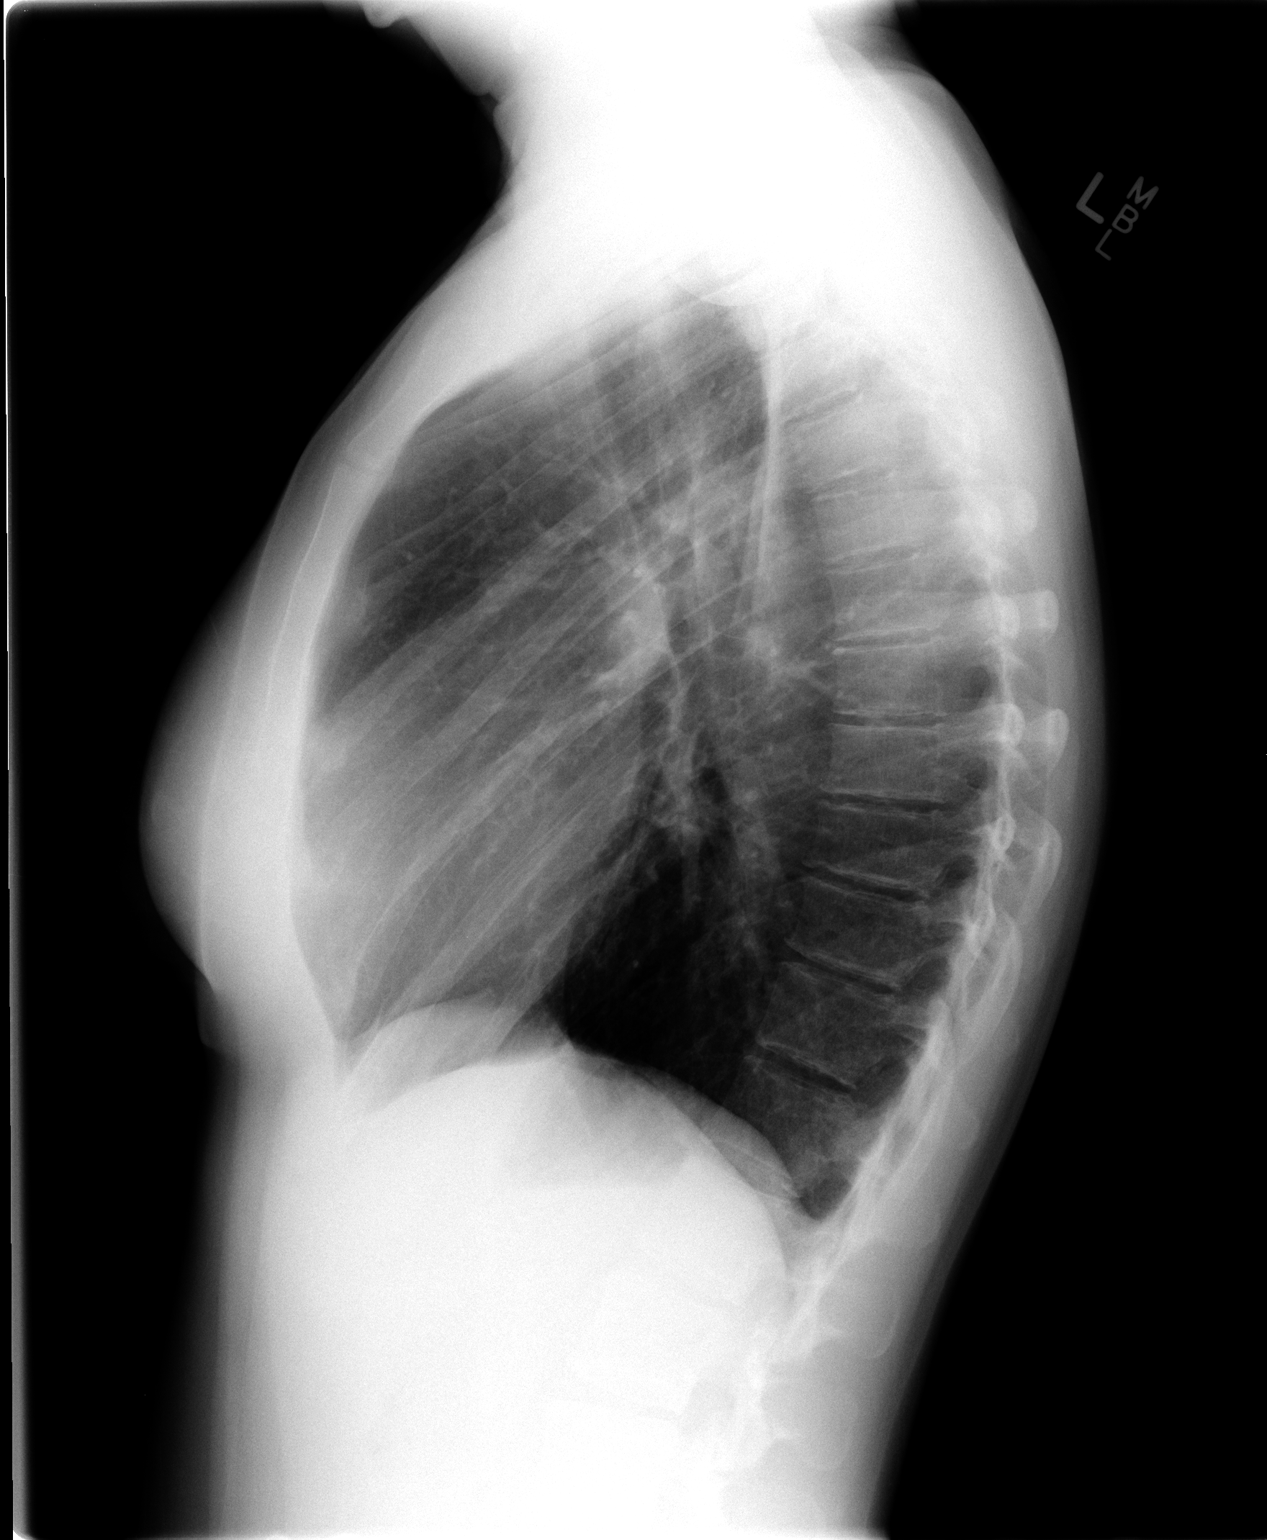

[2 of 2 positions shown; findings below may reference images not displayed]

FINDINGS: Cardiomediastinal silhouette is stable. Mild upper thoracic
levoscoliosis. No acute infiltrate or pleural effusion. No pulmonary
edema.
IMPRESSION: No active cardiopulmonary disease.

## 2015-10-28 ENCOUNTER — Other Ambulatory Visit: Payer: Self-pay | Admitting: Nurse Practitioner

## 2015-10-31 NOTE — Telephone Encounter (Signed)
Medication refill request: Pristiq 50mg  Last AEX:  02/02/15 PG Next AEX: 02/08/16 Last MMG (if hormonal medication request): n/a Refill authorized: 04/15/15 #90 w/0 refills; today please advise

## 2016-02-07 ENCOUNTER — Telehealth: Payer: Self-pay | Admitting: Nurse Practitioner

## 2016-02-07 NOTE — Telephone Encounter (Signed)
OK may close encounter.

## 2016-02-07 NOTE — Telephone Encounter (Signed)
Patient canceled her annual appointment for tomorrow she moved to [facility]. Recall?

## 2016-02-08 ENCOUNTER — Ambulatory Visit: Payer: BLUE CROSS/BLUE SHIELD | Admitting: Nurse Practitioner

## 2016-03-06 ENCOUNTER — Telehealth: Payer: Self-pay | Admitting: *Deleted

## 2016-03-06 NOTE — Telephone Encounter (Signed)
Please send a letter reminding the patient to get her f/u pap smear and offer to send her records to her new provider.

## 2016-03-06 NOTE — Telephone Encounter (Signed)
Patient in 08 recall for 02/2016. According to last phone note patient has moved to [place]. Please advise of recall status  Thanks- eh

## 2016-03-12 ENCOUNTER — Encounter: Payer: Self-pay | Admitting: *Deleted

## 2016-03-12 NOTE — Telephone Encounter (Signed)
Letter sent - removed from recall -eh
